# Patient Record
Sex: Male | Born: 1965 | Race: Black or African American | Hispanic: No | Marital: Single | Smoking: Current every day smoker
Health system: Southern US, Community
[De-identification: ages and names within clinical notes are randomized; demographics above are authoritative.]

## PROBLEM LIST (undated history)

## (undated) DIAGNOSIS — T1491XA Suicide attempt, initial encounter: Secondary | ICD-10-CM

## (undated) DIAGNOSIS — F1410 Cocaine abuse, uncomplicated: Secondary | ICD-10-CM

## (undated) HISTORY — PX: OTHER SURGICAL HISTORY: SHX169

---

## 2012-12-23 ENCOUNTER — Encounter (HOSPITAL_BASED_OUTPATIENT_CLINIC_OR_DEPARTMENT_OTHER): Payer: Self-pay | Admitting: Emergency Medicine

## 2012-12-23 ENCOUNTER — Emergency Department (HOSPITAL_BASED_OUTPATIENT_CLINIC_OR_DEPARTMENT_OTHER)
Admission: EM | Admit: 2012-12-23 | Discharge: 2012-12-24 | Disposition: A | Discharge status: Home / Self Care | Payer: Self-pay | Attending: Emergency Medicine | Admitting: Emergency Medicine

## 2012-12-23 ENCOUNTER — Emergency Department (HOSPITAL_BASED_OUTPATIENT_CLINIC_OR_DEPARTMENT_OTHER): Payer: Self-pay

## 2012-12-23 DIAGNOSIS — R45851 Suicidal ideations: Secondary | ICD-10-CM | POA: Insufficient documentation

## 2012-12-23 DIAGNOSIS — F1410 Cocaine abuse, uncomplicated: Secondary | ICD-10-CM | POA: Insufficient documentation

## 2012-12-23 DIAGNOSIS — F17200 Nicotine dependence, unspecified, uncomplicated: Secondary | ICD-10-CM | POA: Insufficient documentation

## 2012-12-23 DIAGNOSIS — F1910 Other psychoactive substance abuse, uncomplicated: Secondary | ICD-10-CM

## 2012-12-23 HISTORY — DX: Cocaine abuse, uncomplicated: F14.10

## 2012-12-23 HISTORY — DX: Suicide attempt, initial encounter: T14.91XA

## 2012-12-23 LAB — ETHANOL: Alcohol, Ethyl (B): <11 mg/dL (ref 0–11)

## 2012-12-23 LAB — CBC
HCT: 39.8 % (ref 39.0–52.0)
Hemoglobin: 13.7 g/dL (ref 13.0–17.0)
MCV: 80.2 fL (ref 78.0–100.0)
RBC: 4.96 MIL/uL (ref 4.22–5.81)
WBC: 4.5 10*3/uL (ref 4.0–10.5)

## 2012-12-23 LAB — COMPREHENSIVE METABOLIC PANEL
Albumin: 3.1 g/dL — ABNORMAL LOW (ref 3.5–5.2)
Alkaline Phosphatase: 57 U/L (ref 39–117)
BUN: 9 mg/dL (ref 6–23)
CO2: 23 mEq/L (ref 19–32)
Chloride: 107 mEq/L (ref 96–112)
Creatinine, Ser: 1.1 mg/dL (ref 0.50–1.35)
GFR calc non Af Amer: 78 mL/min — ABNORMAL LOW (ref >90)
Glucose, Bld: 89 mg/dL (ref 70–99)
Potassium: 3.6 mEq/L (ref 3.5–5.1)
Total Bilirubin: 0.5 mg/dL (ref 0.3–1.2)

## 2012-12-23 LAB — URINALYSIS, ROUTINE W REFLEX MICROSCOPIC
Glucose, UA: NEGATIVE mg/dL
Hgb urine dipstick: NEGATIVE
Leukocytes, UA: NEGATIVE
Protein, ur: NEGATIVE mg/dL
Specific Gravity, Urine: 1.026 (ref 1.005–1.030)
Urobilinogen, UA: 1 mg/dL (ref 0.0–1.0)

## 2012-12-23 LAB — RAPID URINE DRUG SCREEN, HOSP PERFORMED
Amphetamines: NOT DETECTED
Opiates: NOT DETECTED

## 2012-12-23 LAB — URIC ACID: Uric Acid, Serum: 7.3 mg/dL (ref 4.0–7.8)

## 2012-12-23 MED ORDER — ACETAMINOPHEN 325 MG PO TABS: 650.0000 mg | ORAL_TABLET | ORAL | Status: DC | PRN

## 2012-12-23 MED ORDER — ALUM & MAG HYDROXIDE-SIMETH 200-200-20 MG/5ML PO SUSP: 30.0000 mL | ORAL | Status: DC | PRN

## 2012-12-23 MED ORDER — ZOLPIDEM TARTRATE 5 MG PO TABS
5.0000 mg | ORAL_TABLET | Freq: Every evening | ORAL | Status: DC | PRN
  Filled 2012-12-23: qty 1

## 2012-12-23 MED ORDER — ONDANSETRON HCL 4 MG PO TABS: 4.0000 mg | ORAL_TABLET | Freq: Three times a day (TID) | ORAL | Status: DC | PRN

## 2012-12-23 NOTE — ED Notes (Signed)
The patient's belonging have been taken away from him. He had on boots, socks, pants, shorts and boxers. Then he had on a T-shirt, hat, dog tags, and 3 rubber bractlet's. He also brought in a book bag full of stuff. It was not check. They are all under the charge nurse desk.

## 2012-12-23 NOTE — ED Notes (Signed)
Mobile crisis is at bedside with the patient.

## 2012-12-23 NOTE — BH Assessment (Signed)
[facility] Assessment Progress Note  Pt referred via Marijean Niemann with [employer].  Pt reviewed with Berneice Heinrich, RN, Administrative Coordinator.  She does not feel that pt presents an immanent danger to self, and therefore declines pt for admission to [facility] based upon lack of criteria.  At 17:42 I called to Marijean Niemann to notify him.  Doylene Canning, MA Assessment Counselor 12/23/2012 @ 17:46

## 2012-12-23 NOTE — ED Notes (Signed)
[facility] called to transport to [place] 

## 2012-12-23 NOTE — ED Notes (Signed)
Patient admits to regularly using crack-cocaine. Last use was last night.

## 2012-12-23 NOTE — ED Notes (Signed)
Spoke with [facility] and they expressed they would be here in appx an hour and 40 minutes.

## 2012-12-23 NOTE — ED Provider Notes (Signed)
Pt with cocaine abuse, is reporting SI, but is vague about thoughts.  [facility] has seen, attempting placement.  There is no sitter available here at [facility] tonight, no BH ED rooms at [facility], will transfer to [facility].  Discussed with Dr. Rulon Abide.  Pt would likely benefit from a telepsych eval.  Rolan Bucco, MD 12/23/12 2333

## 2012-12-23 NOTE — ED Provider Notes (Signed)
  CSN: [id number]     Arrival date & time 12/23/12  1026 History     First MD Initiated Contact with Patient 12/23/12 1052     Chief Complaint  Patient presents with  . Leg Pain  . Medical Clearance   (Consider location/radiation/quality/duration/timing/severity/associated sxs/prior Treatment) Patient is a 47 y.o. male presenting with leg pain.  Leg Pain  Pt reports 3 days of moderate to severe non-traumatic R ankle pain, worse with movement. No history of same.   Pt also presents requesting detox from crack cocaine. Has a long history of cocaine abuse, has been through treatment before several years ago. Had been clean for 3 weeks until last night when he used again. He has vague suicidal thoughts, but no plan. He denies any CP, SOB. No HI, no hallucinations.  History reviewed. No pertinent past medical history. History reviewed. No pertinent past surgical history. History reviewed. No pertinent family history. History  Substance Use Topics  . Smoking status: Not on file  . Smokeless tobacco: Not on file  . Alcohol Use: Not on file    Review of Systems All other systems reviewed and are negative except as noted in HPI.   Allergies  Aspirin and Penicillins  Home Medications  No current outpatient prescriptions on file. BP 132/88  Pulse 62  Temp(Src) 98.3 F (36.8 C) (Oral)  Resp 20  Ht 5\' 9"  (1.753 m)  Wt 187 lb (84.823 kg)  BMI 27.6 kg/m2  SpO2 100% Physical Exam  Nursing note and vitals reviewed. Constitutional: He is oriented to person, place, and time. He appears well-developed and well-nourished.  HENT:  Head: Normocephalic and atraumatic.  Eyes: EOM are normal. Pupils are equal, round, and reactive to light.  Neck: Normal range of motion. Neck supple.  Cardiovascular: Normal rate, normal heart sounds and intact distal pulses.   Pulmonary/Chest: Effort normal and breath sounds normal.  Abdominal: Bowel sounds are normal. He exhibits no distension. There is no  tenderness.  Musculoskeletal: He exhibits tenderness (R ankle diffuse tender to palpation with mild warmth and erythema, no significant effusion, no deformity, NV intact). He exhibits no edema.  Neurological: He is alert and oriented to person, place, and time. He has normal strength. No cranial nerve deficit or sensory deficit.  Skin: Skin is warm and dry. No rash noted.  Psychiatric: He has a normal mood and affect.    ED Course   Procedures (including critical care time)  Labs Reviewed  URINE RAPID DRUG SCREEN (HOSP PERFORMED) - Abnormal; Notable for the following:    Cocaine POSITIVE (*)    All other components within normal limits  URINALYSIS, ROUTINE W REFLEX MICROSCOPIC - Abnormal; Notable for the following:    Ketones, ur 15 (*)    All other components within normal limits  CBC  COMPREHENSIVE METABOLIC PANEL  ETHANOL  URIC ACID   Dg Ankle Complete Right  12/23/2012   *RADIOLOGY REPORT*  Clinical Data: Ankle pain.  No injury.  RIGHT ANKLE - COMPLETE 3+ VIEW  Comparison: 0400  Findings: No acute fracture and no dislocation.  IMPRESSION: No acute bony pathology.   Original Report Authenticated By: Jolaine Click, M.D.   No diagnosis found.  MDM  Received call from Dr. Bonnielee Haff, pt had ankle xray at [facility] about 8 hours ago. Pt did not inform me he was there. Care signed out to Dr. Fredderick Phenix at shift change pending [facility] evaluation and placement.   Tecora Eustache B. Bernette Mayers, MD 12/24/12 1058

## 2012-12-23 NOTE — ED Notes (Signed)
Pt states he is having some right ankle/lower leg pain since Thursday.  Denies injury.  Some swelling per patient.  Pt also wants detox from crack cocaine.  Uses crack for over 20 years, last use yesterday.

## 2012-12-24 ENCOUNTER — Encounter (HOSPITAL_COMMUNITY): Payer: Self-pay | Admitting: Emergency Medicine

## 2012-12-24 NOTE — ED Provider Notes (Signed)
7:53 AM No SI. Pt presents with substance abuse. Outpatient referrals given  Lyanne Co, MD 12/24/12 [phone]

## 2012-12-24 NOTE — ED Notes (Signed)
According to EMS, patient responded to [facility] med center due to leg pain.  Once there, he let them know he wanted to harm himself.  He said he has done drugs in the past and was afraid to go home because he says he will do crack cocaine.

## 2012-12-27 ENCOUNTER — Encounter (HOSPITAL_COMMUNITY): Payer: Self-pay | Admitting: Emergency Medicine

## 2012-12-27 ENCOUNTER — Emergency Department (HOSPITAL_COMMUNITY)
Admission: EM | Admit: 2012-12-27 | Discharge: 2012-12-27 | Disposition: A | Discharge status: Home / Self Care | Payer: Self-pay | Attending: Emergency Medicine | Admitting: Emergency Medicine

## 2012-12-27 DIAGNOSIS — F17200 Nicotine dependence, unspecified, uncomplicated: Secondary | ICD-10-CM | POA: Insufficient documentation

## 2012-12-27 DIAGNOSIS — L03115 Cellulitis of right lower limb: Secondary | ICD-10-CM

## 2012-12-27 DIAGNOSIS — L03119 Cellulitis of unspecified part of limb: Secondary | ICD-10-CM | POA: Insufficient documentation

## 2012-12-27 DIAGNOSIS — F1410 Cocaine abuse, uncomplicated: Secondary | ICD-10-CM | POA: Insufficient documentation

## 2012-12-27 DIAGNOSIS — L02419 Cutaneous abscess of limb, unspecified: Secondary | ICD-10-CM | POA: Insufficient documentation

## 2012-12-27 LAB — COMPREHENSIVE METABOLIC PANEL
ALT: 19 U/L (ref 0–53)
Albumin: 3.2 g/dL — ABNORMAL LOW (ref 3.5–5.2)
Alkaline Phosphatase: 56 U/L (ref 39–117)
Chloride: 107 mEq/L (ref 96–112)
Potassium: 4 mEq/L (ref 3.5–5.1)
Sodium: 143 mEq/L (ref 135–145)
Total Bilirubin: 0.2 mg/dL — ABNORMAL LOW (ref 0.3–1.2)
Total Protein: 5.6 g/dL — ABNORMAL LOW (ref 6.0–8.3)

## 2012-12-27 LAB — CBC
Hemoglobin: 14.8 g/dL (ref 13.0–17.0)
MCHC: 33 g/dL (ref 30.0–36.0)
RDW: 13.5 % (ref 11.5–15.5)
WBC: 5.3 10*3/uL (ref 4.0–10.5)

## 2012-12-27 MED ORDER — CEPHALEXIN 500 MG PO CAPS: 500.0000 mg | ORAL_CAPSULE | Freq: Four times a day (QID) | ORAL | Status: DC

## 2012-12-27 MED ORDER — SODIUM CHLORIDE 0.9 % IV SOLN
INTRAVENOUS | Status: DC
  Administered 2012-12-27: 08:00:00 via INTRAVENOUS

## 2012-12-27 MED ORDER — CEFAZOLIN SODIUM-DEXTROSE 2-3 GM-% IV SOLR
2.0000 g | Freq: Once | INTRAVENOUS | Status: AC
  Administered 2012-12-27: 2 g via INTRAVENOUS
  Filled 2012-12-27: qty 50

## 2012-12-27 NOTE — ED Notes (Signed)
PT ambulated with baseline gait; VSS; A&Ox3; no signs of distress; respirations even and unlabored; skin warm and dry; no questions upon discharge. PT informed that px is free at [facility] if he applies for a VIC card.

## 2012-12-27 NOTE — ED Notes (Addendum)
PT reports he was recently at [facility] for R leg pain; reports he was also there for substance abuse and SI but denies that right now. Pt asked to inform RN if that changes. R leg is swollen and painful. Denies SOB or chest pain.

## 2012-12-27 NOTE — ED Notes (Signed)
Case manager called to assist with med refill.

## 2012-12-27 NOTE — ED Provider Notes (Signed)
CSN: [id number]     Arrival date & time 12/27/12  0709 History     First MD Initiated Contact with Patient 12/27/12 [phone]     Chief Complaint  Patient presents with  . Leg Pain   (Consider location/radiation/quality/duration/timing/severity/associated sxs/prior Treatment) Patient is a 47 y.o. male presenting with leg pain. The history is provided by the patient.  Leg Pain Associated symptoms: no fever   pt c/o right lower leg pain for the past few days. Associated redness and swelling. Pt denies injury, trauma or fall. Pain constant, dull, non radiating. Worse w palpation. No fever or chills. Denies hx same symptoms. No hx dvt or pe. No hx gout. Denies cp or sob. Pt went to [facility], reports u/s and xrays neg. Was given rx for abx but hasnt filled.      Past Medical History  Diagnosis Date  . Cocaine abuse   . Suicide attempt     advises has attempted to hang self in past   History reviewed. No pertinent past surgical history. History reviewed. No pertinent family history. History  Substance Use Topics  . Smoking status: Current Every Day Smoker -- 0.50 packs/day  . Smokeless tobacco: Not on file  . Alcohol Use: No    Review of Systems  Constitutional: Negative for fever.  Respiratory: Negative for shortness of breath.   Cardiovascular: Negative for chest pain.  Musculoskeletal: Negative for joint swelling.  Skin: Negative for wound.  Neurological: Negative for numbness.    Allergies  Aspirin and Penicillins  Home Medications  No current outpatient prescriptions on file. BP 127/77  Pulse 65  Temp(Src) 98.3 F (36.8 C) (Oral)  Resp 18  SpO2 100% Physical Exam  Nursing note and vitals reviewed. Constitutional: He is oriented to person, place, and time. He appears well-developed and well-nourished. No distress.  HENT:  Head: Atraumatic.  Eyes: Conjunctivae are normal.  Neck: Neck supple. No tracheal deviation present.  Cardiovascular: Normal rate.    Pulmonary/Chest: Effort normal. No accessory muscle usage. No respiratory distress.  Abdominal: He exhibits no distension.  Musculoskeletal: Normal range of motion.  Mild swelling, tenderness and erythema to right lower leg above ankle, esp anteriorly. No calf pain or swelling. Skin intact, no abrasions/lacs. Distal pulses palp.  Exam c/w cellulitis. No crepitus.   Neurological: He is alert and oriented to person, place, and time.  Skin: Skin is warm and dry.  Psychiatric: He has a normal mood and affect.    ED Course   Procedures (including critical care time)  Results for orders placed during the hospital encounter of 12/27/12  CBC      Result Value Range   WBC 5.3  4.0 - 10.5 K/uL   RBC 5.49  4.22 - 5.81 MIL/uL   Hemoglobin 14.8  13.0 - 17.0 g/dL   HCT 42.5  95.6 - 38.7 %   MCV 81.6  78.0 - 100.0 fL   MCH 27.0  26.0 - 34.0 pg   MCHC 33.0  30.0 - 36.0 g/dL   RDW 56.4  33.2 - 95.1 %   Platelets 271  150 - 400 K/uL  COMPREHENSIVE METABOLIC PANEL      Result Value Range   Sodium 143  135 - 145 mEq/L   Potassium 4.0  3.5 - 5.1 mEq/L   Chloride 107  96 - 112 mEq/L   CO2 28  19 - 32 mEq/L   Glucose, Bld 90  70 - 99 mg/dL   BUN 12  6 - 23 mg/dL   Creatinine, Ser 1.61  0.50 - 1.35 mg/dL   Calcium 8.9  8.4 - 09.6 mg/dL   Total Protein 5.6 (*) 6.0 - 8.3 g/dL   Albumin 3.2 (*) 3.5 - 5.2 g/dL   AST 23  0 - 37 U/L   ALT 19  0 - 53 U/L   Alkaline Phosphatase 56  39 - 117 U/L   Total Bilirubin 0.2 (*) 0.3 - 1.2 mg/dL   GFR calc non Af Amer 71 (*) >90 mL/min   GFR calc Af Amer 83 (*) >90 mL/min   Dg Ankle Complete Right  12/23/2012   *RADIOLOGY REPORT*  Clinical Data: Ankle pain.  No injury.  RIGHT ANKLE - COMPLETE 3+ VIEW  Comparison: 0400  Findings: No acute fracture and no dislocation.  IMPRESSION: No acute bony pathology.   Original Report Authenticated By: Jolaine Click, M.D.      MDM  Iv ancef 2 gm iv.   Labs.  Reviewed nursing notes and prior charts for additional  history.   Records from [facility] obtained, 7/26, pt had vascular u/s right leg neg for dvt, had xrays neg, and labs normal.  Pt appears stable for d/c.  Will refer to [facility] for pcp/f/u, sw/pharmacy to assist w abx.     Suzi Roots, MD 12/27/12 1014

## 2014-01-27 IMAGING — CR DG ANKLE COMPLETE 3+V*R*
3 series · 3 of 3 positions shown · non-contrast
Comparison: 9199

CLINICAL DATA: Ankle pain.  No injury.

RIGHT ANKLE - COMPLETE 3+ VIEW

[t ankle joint ap right]
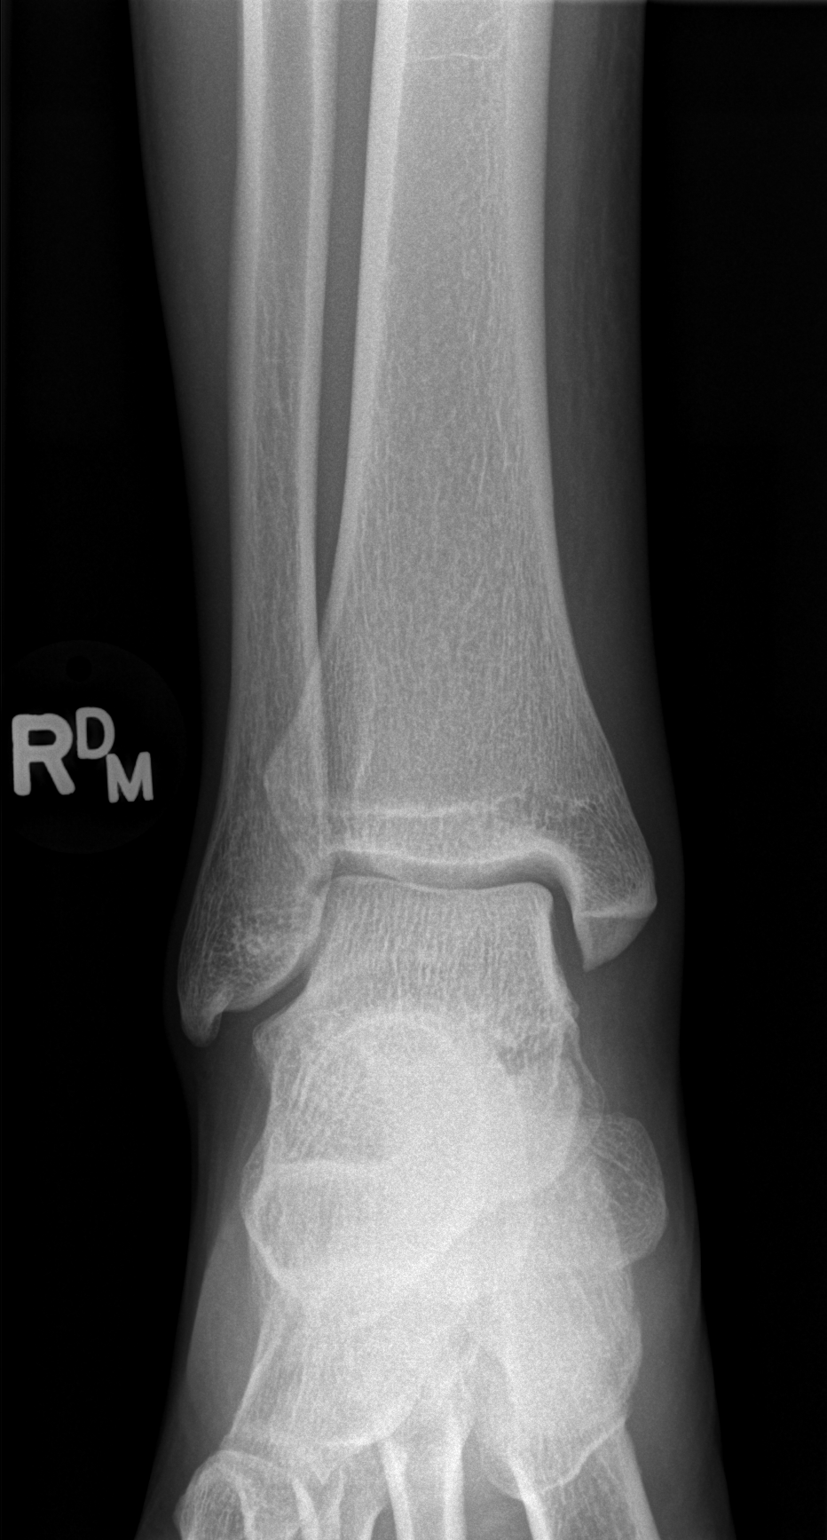

[t ankle joint oblique right]
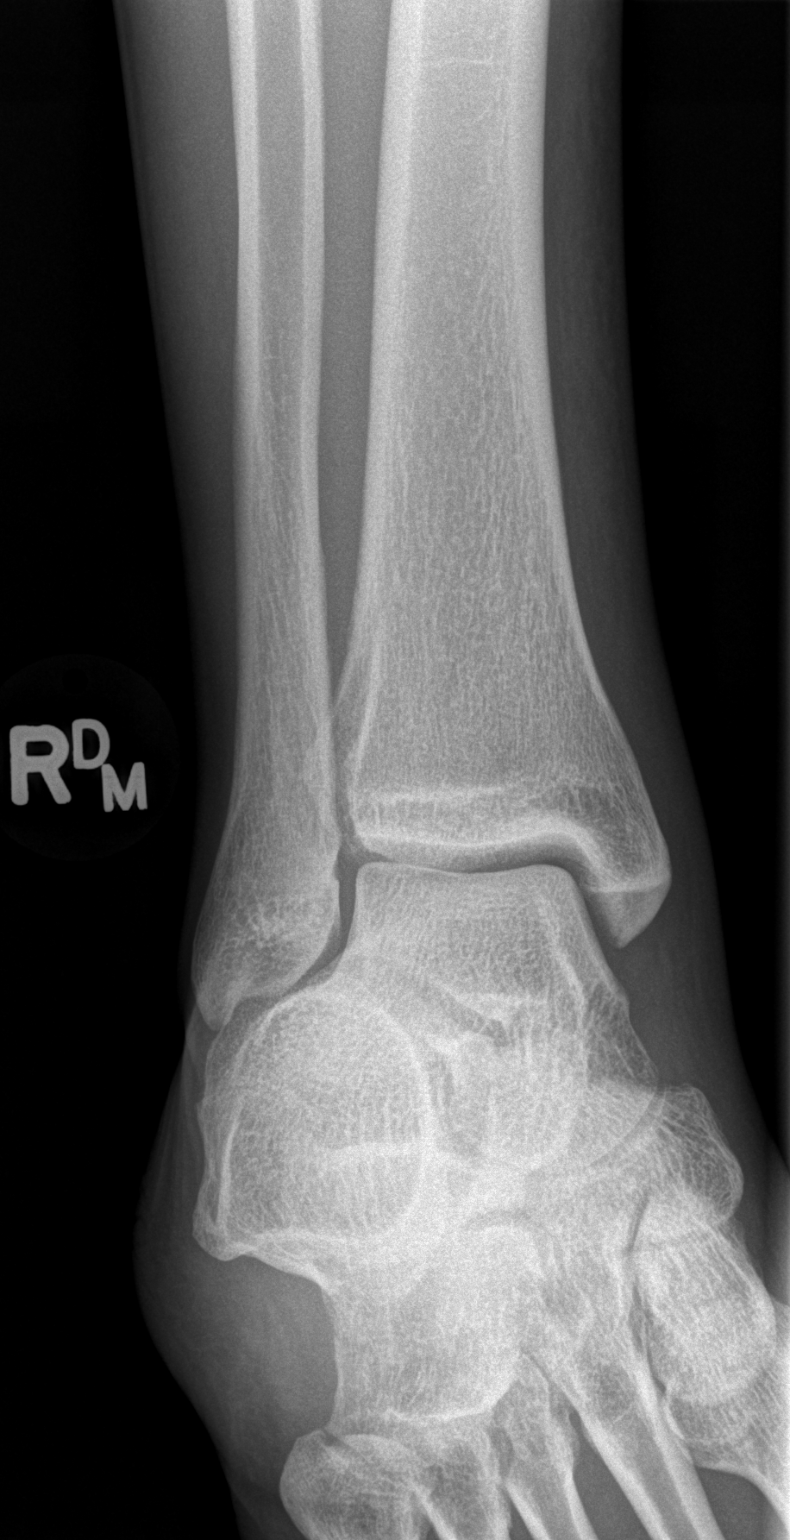

[t ankle joint lat right]
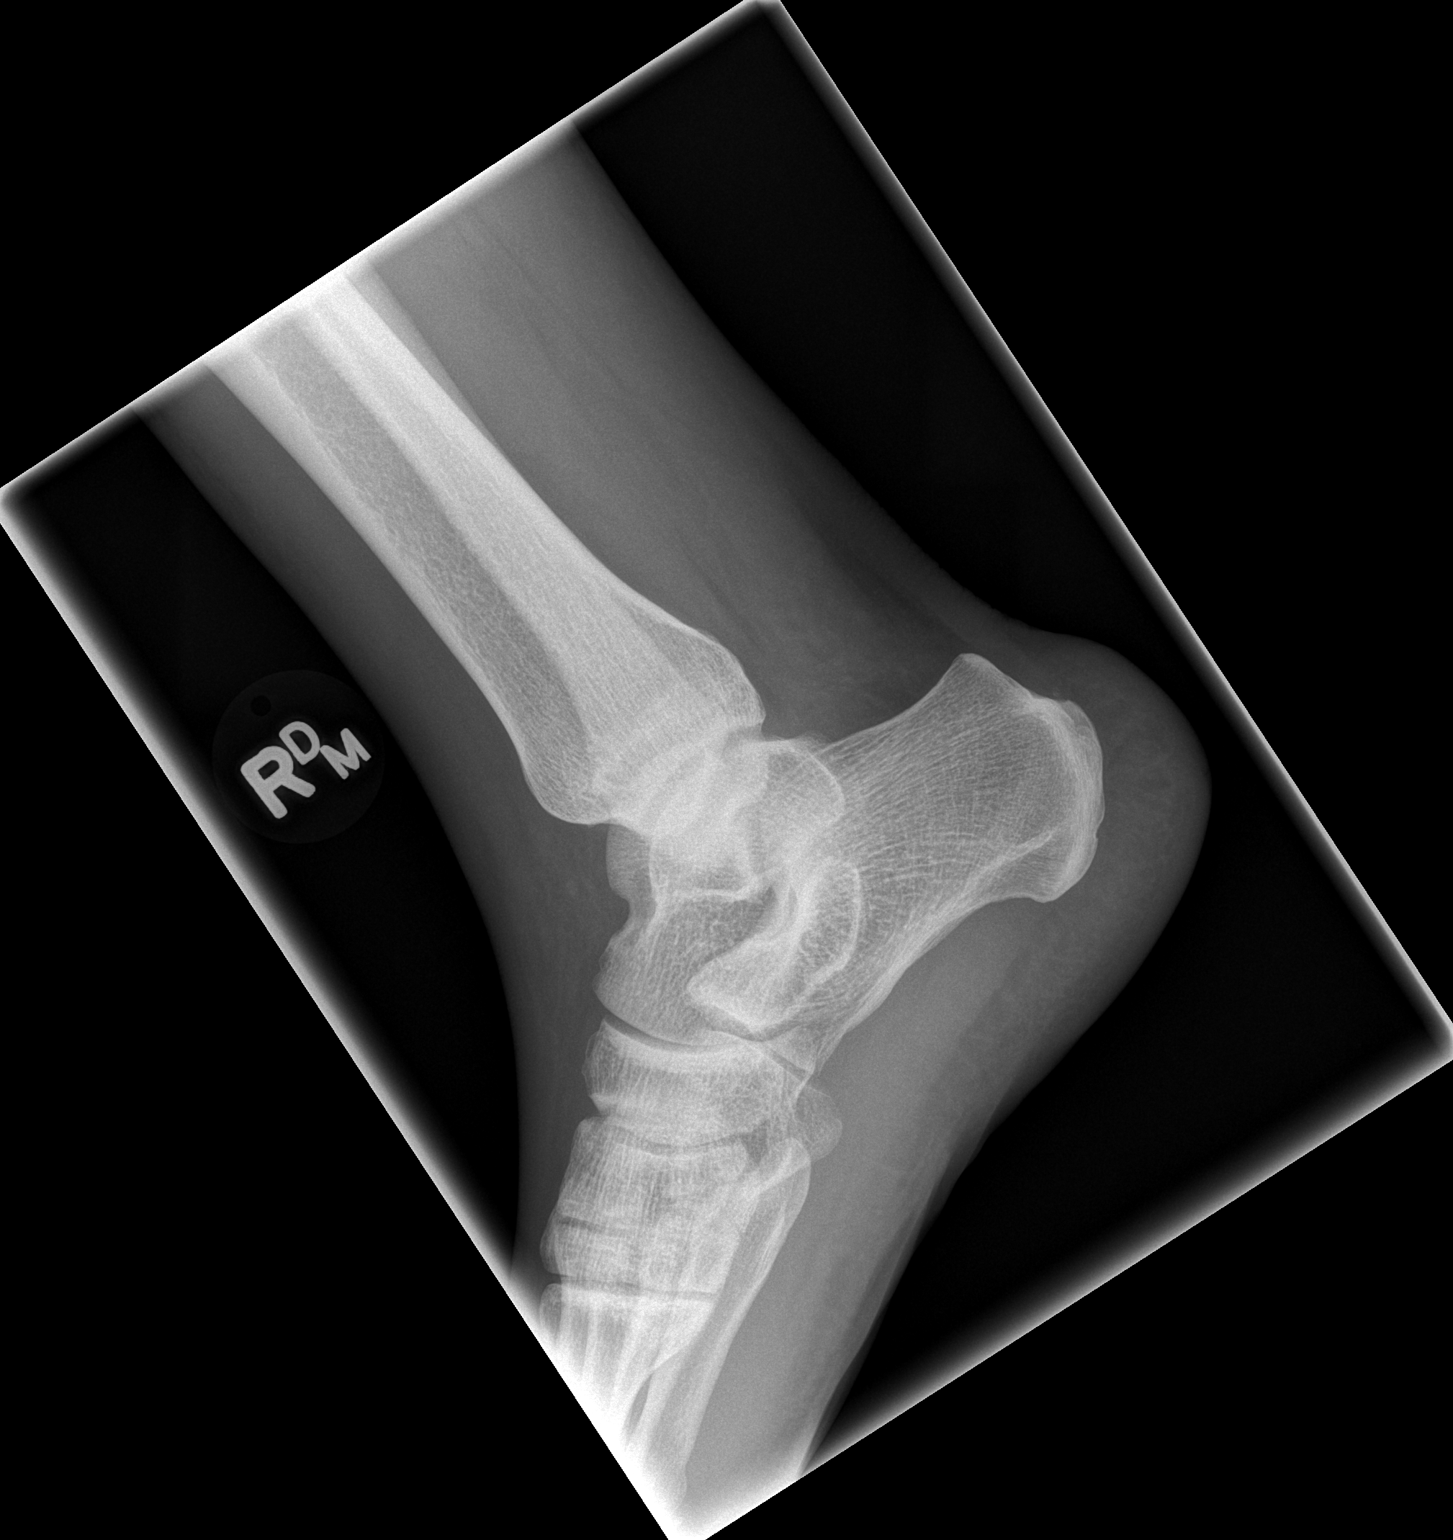

[3 of 3 positions shown; findings below may reference images not displayed]

FINDINGS: No acute fracture and no dislocation.
IMPRESSION: No acute bony pathology.

## 2020-08-30 DIAGNOSIS — F17210 Nicotine dependence, cigarettes, uncomplicated: Secondary | ICD-10-CM | POA: Insufficient documentation

## 2020-08-30 DIAGNOSIS — F1491 Cocaine use, unspecified, in remission: Secondary | ICD-10-CM | POA: Insufficient documentation

## 2020-08-30 DIAGNOSIS — F1410 Cocaine abuse, uncomplicated: Secondary | ICD-10-CM | POA: Insufficient documentation

## 2022-06-08 ENCOUNTER — Ambulatory Visit (HOSPITAL_COMMUNITY)
Admission: EM | Admit: 2022-06-08 | Discharge: 2022-06-08 | Disposition: A | Discharge status: Home / Self Care | Payer: 59 | Attending: Family Medicine | Admitting: Family Medicine

## 2022-06-08 ENCOUNTER — Encounter (HOSPITAL_COMMUNITY): Payer: Self-pay

## 2022-06-08 DIAGNOSIS — J069 Acute upper respiratory infection, unspecified: Secondary | ICD-10-CM | POA: Diagnosis not present

## 2022-06-08 LAB — POC INFLUENZA A AND B ANTIGEN (URGENT CARE ONLY)
INFLUENZA A ANTIGEN, POC: NEGATIVE
INFLUENZA B ANTIGEN, POC: NEGATIVE

## 2022-06-08 MED ORDER — PROMETHAZINE-DM 6.25-15 MG/5ML PO SYRP: 5.0000 mL | ORAL_SOLUTION | Freq: Four times a day (QID) | ORAL | 0 refills | Status: DC | PRN

## 2022-06-08 MED ORDER — ACETAMINOPHEN 500 MG PO TABS: 500.0000 mg | ORAL_TABLET | Freq: Four times a day (QID) | ORAL | 0 refills | Status: DC | PRN

## 2022-06-08 NOTE — Discharge Instructions (Signed)
Follow up with your primary care doctor or here if you are not seeing improvement of your symptoms over the next several days, sooner if you feel you are worsening.  Caring for yourself: Get plenty of rest. Drink plenty of fluids, enough so that your urine is light yellow or clear like water. If you have kidney, heart, or liver disease and have to limit fluids, talk with your doctor before you increase the amount of fluids you drink. Take an over-the-counter pain medicine if needed, such as acetaminophen (Tylenol), ibuprofen (Advil, Motrin), or naproxen (Aleve), to relieve fever, headache, and muscle aches. Read and follow all instructions on the label. No one younger than 20 should take aspirin. It has been linked to Reye syndrome, a serious illness. Before you use over the counter cough and cold medicines, check the label. These medicines may not be safe for children younger than age 41 or for people with certain health problems. If the skin around your nose and lips becomes sore, put some petroleum jelly on the area.  Avoid spreading a viral illness: Wash your hands regularly, and keep your hands away from your face.  Stay home from school, work, and other public places until you are feeling better and your fever has been gone for at least 24 hours. The fever needs to have gone away on its own without the help of medicine.

## 2022-06-08 NOTE — ED Provider Notes (Signed)
  [facility]   [id number] 06/08/22 Arrival Time: 8127  ASSESSMENT & PLAN:  1. Viral URI with cough    Discussed typical duration of likely viral illness.  Results for orders placed or performed during the hospital encounter of 06/08/22  POC Influenza A & B Ag (Urgent Care)  Result Value Ref Range   INFLUENZA A ANTIGEN, POC NEGATIVE NEGATIVE   INFLUENZA B ANTIGEN, POC NEGATIVE NEGATIVE    OTC symptom care as needed.  Meds ordered this encounter  Medications   promethazine-dextromethorphan (PROMETHAZINE-DM) 6.25-15 MG/5ML syrup    Sig: Take 5 mLs by mouth 4 (four) times daily as needed for cough.    Dispense:  118 mL    Refill:  0   acetaminophen (TYLENOL) 500 MG tablet    Sig: Take 1 tablet (500 mg total) by mouth every 6 (six) hours as needed for moderate pain or fever.    Dispense:  30 tablet    Refill:  0   May f/u as needed.  Reviewed expectations re: course of current medical issues. Questions answered. Outlined signs and symptoms indicating need for more acute intervention. Understanding verbalized. After Visit Summary given.   SUBJECTIVE: History from: Patient. Ruben Mitchell is a 57 y.o. male. Reports: runny nose, nasal cong, HA, cough, body aches, chills; abrupt onset; x 2 days. Denies: difficulty breathing. Normal PO intake without n/v/d. Currently staying at shelter past 7 days,.  OBJECTIVE:  Vitals:   06/08/22 1220  Pulse: 89  Resp: 16  Temp: 100 F (37.8 C)  TempSrc: Oral  SpO2: 92%    General appearance: alert; no distress Eyes: PERRLA; EOMI; conjunctiva normal HENT: [place]; AT; with nasal congestion Neck: supple  Lungs: speaks full sentences without difficulty; unlabored; dry cough; ctab Extremities: no edema Skin: warm and dry Neurologic: normal gait Psychological: alert and cooperative; normal mood and affect   Allergies  Allergen Reactions   Aspirin Other (See Comments)    unknown   Penicillins Other (See Comments)    unknown     Past Medical History:  Diagnosis Date   Cocaine abuse ([place])    Suicide attempt ([place])    advises has attempted to hang self in past   Social History   Socioeconomic History   Marital status: Single    Spouse name: Not on file   Number of children: Not on file   Years of education: Not on file   Highest education level: Not on file  Occupational History   Not on file  Tobacco Use   Smoking status: Every Day    Packs/day: 0.50    Types: Cigarettes   Smokeless tobacco: Not on file  Substance and Sexual Activity   Alcohol use: No   Drug use: Yes    Frequency: 2.0 times per week    Types: Cocaine, Marijuana   Sexual activity: Not on file  Other Topics Concern   Not on file  Social History Narrative   Not on file   Social Determinants of Health   Financial Resource Strain: Not on file  Food Insecurity: Not on file  Transportation Needs: Not on file  Physical Activity: Not on file  Stress: Not on file  Social Connections: Not on file  Intimate Partner Violence: Not on file   History reviewed. No pertinent family history. History reviewed. No pertinent surgical history.   Vanessa Kick, MD 06/08/22 [phone]

## 2022-06-08 NOTE — ED Triage Notes (Signed)
Pt is here for runny nose, nasal congestion, headache, cough, back pain, chills, body aches, loss of appetite, x 2days

## 2022-06-08 NOTE — Congregational Nurse Program (Signed)
  Dept: [phone]   Congregational Nurse Program Note  Date of Encounter: 06/08/2022  Clinic visit for respirator symptoms of cough, congestion and yesterday body aches.  Temp 99, BP 127/88, O2 Sat 94%, pulse 88 and regular.  Large amount nasal drainage.  Has [employer] but no PCP arranged transportation to go to [facility] urgent Care to evaluate for flu. Past Medical History: Past Medical History:  Diagnosis Date   Cocaine abuse ([place])    Suicide attempt [facility])    advises has attempted to hang self in past    Encounter Details:  CNP Questionnaire - 06/08/22 1010       Questionnaire   Ask client: Do you give verbal consent for me to treat you today? Yes    Student Assistance N/A    Location Patient [facility]    Visit Setting with Client Organization    Patient Status Research officer, political party or [place]    Insurance/Financial Assistance Referral N/A    Medication Have Medication Insecurities    Medical Provider No    Screening Referrals Made N/A    Medical Referrals Made Urgent Care    Medical Appointment Made N/A    Recently w/o PCP, now 1st time PCP visit completed due to CNs referral or appointment made N/A    Food Have Food Insecurities    Transportation Need transportation assistance;Provided transportation assistance    Housing/Utilities No permanent housing    Interpersonal Safety Do not feel safe at current residence    Interventions Advocate/Support;Navigate Healthcare System;Counsel;Case Management;Educate    Abnormal to Normal Screening Since Last CN Visit N/A    Screenings CN Performed Blood Pressure;Temperature;Pulse Ox    Sent Client to Lab for: N/A    Did client attend any of the following based off CNs referral or appointments made? N/A    ED Visit Averted Yes    Life-Saving Intervention Made N/A

## 2022-06-09 ENCOUNTER — Encounter: Payer: Self-pay | Admitting: Physician Assistant

## 2022-06-09 ENCOUNTER — Other Ambulatory Visit: Payer: Self-pay | Admitting: Critical Care Medicine

## 2022-06-09 ENCOUNTER — Other Ambulatory Visit: Payer: Self-pay

## 2022-06-09 MED ORDER — PROMETHAZINE-DM 6.25-15 MG/5ML PO SYRP
5.0000 mL | ORAL_SOLUTION | Freq: Four times a day (QID) | ORAL | 0 refills | Status: DC | PRN
  Filled 2022-06-09: qty 118, 6d supply, fill #0

## 2022-06-09 NOTE — Progress Notes (Cosign Needed Addendum)
Pt seen by Dr Joya Gaskins.  He went to the ER yesterday for URI, flu negative, not tested for COVID.   He was COVID negative on entry to the shelter 01/04. Sx started not too long after that.   Cough prod yellow sputum. He had some chills, but fever is unknown.   Sx began about a week ago.   Not working, has [employer]. Needs [facility]. However, does not have money to pay for rx >> rx will be sent to [facility].   Cocaine was a long time ago, not suicidal now.   He still smokes, but plans to quit by the end of the month.   COVID test negative  Lungs clear, clear nasal secretions. Poor dentition.   Wears size 12 shoes, toenails are very long, but no infection. He was given new shoes and socks, will be scheduled with podiatry when he comes to the office.   Today's Vitals   06/09/22 1532  BP: (!) 148/86  Pulse: 72  SpO2: 96%   There is no height or weight on file to calculate BMI.   He will pick up meds tomorrow, riding the bus. Was given Tylenol at his request.   He will be called w/ an appt.   Rosaria Ferries, PA-C 06/09/2022 3:24 PM

## 2022-06-10 ENCOUNTER — Other Ambulatory Visit: Payer: Self-pay

## 2022-06-10 ENCOUNTER — Other Ambulatory Visit (HOSPITAL_COMMUNITY): Payer: Self-pay

## 2022-06-14 ENCOUNTER — Encounter: Payer: Self-pay | Admitting: Physician Assistant

## 2022-06-14 ENCOUNTER — Ambulatory Visit: Payer: 59 | Admitting: Physician Assistant

## 2022-06-14 VITALS — BP 142/92 | HR 72 | Ht 69.0 in | Wt 199.0 lb

## 2022-06-14 DIAGNOSIS — R030 Elevated blood-pressure reading, without diagnosis of hypertension: Secondary | ICD-10-CM | POA: Diagnosis not present

## 2022-06-14 DIAGNOSIS — Z114 Encounter for screening for human immunodeficiency virus [HIV]: Secondary | ICD-10-CM

## 2022-06-14 DIAGNOSIS — Z0001 Encounter for general adult medical examination with abnormal findings: Secondary | ICD-10-CM

## 2022-06-14 DIAGNOSIS — D72819 Decreased white blood cell count, unspecified: Secondary | ICD-10-CM | POA: Diagnosis not present

## 2022-06-14 DIAGNOSIS — Z13220 Encounter for screening for lipoid disorders: Secondary | ICD-10-CM

## 2022-06-14 DIAGNOSIS — Z5901 Sheltered homelessness: Secondary | ICD-10-CM

## 2022-06-14 DIAGNOSIS — Z125 Encounter for screening for malignant neoplasm of prostate: Secondary | ICD-10-CM

## 2022-06-14 DIAGNOSIS — F17210 Nicotine dependence, cigarettes, uncomplicated: Secondary | ICD-10-CM

## 2022-06-14 DIAGNOSIS — Z1159 Encounter for screening for other viral diseases: Secondary | ICD-10-CM

## 2022-06-14 DIAGNOSIS — Z13228 Encounter for screening for other metabolic disorders: Secondary | ICD-10-CM

## 2022-06-14 NOTE — Progress Notes (Unsigned)
New Patient Office Visit  Subjective    Patient ID: Ruben Mitchell, male    DOB: Sep 11, 1965  Age: 57 y.o. MRN: [id number]  CC:  Chief Complaint  Patient presents with   Annual Exam    To donate plasma     HPI Ruben Mitchell requests physical without concerns  States that he donates plasma and they requested he have one.  Denies any history of hypertension, states that he does not check his blood pressure at home. Return for fasting labs two weeks   Sleep is good, appetite is good, no other concerns at this time.  Outpatient Encounter Medications as of 06/14/2022  Medication Sig   acetaminophen (TYLENOL) 500 MG tablet Take 1 tablet (500 mg total) by mouth every 6 (six) hours as needed for moderate pain or fever. (Patient not taking: Reported on 06/14/2022)   promethazine-dextromethorphan (PROMETHAZINE-DM) 6.25-15 MG/5ML syrup Take 5 mLs by mouth 4 (four) times daily as needed for cough. (Patient not taking: Reported on 06/14/2022)   No facility-administered encounter medications on file as of 06/14/2022.    Past Medical History:  Diagnosis Date   Cocaine abuse ([place])    Suicide attempt [facility])    advises has attempted to hang self in past    History reviewed. No pertinent surgical history.  History reviewed. No pertinent family history.  Social History   Socioeconomic History   Marital status: Single    Spouse name: Not on file   Number of children: Not on file   Years of education: Not on file   Highest education level: Not on file  Occupational History   Not on file  Tobacco Use   Smoking status: Every Day    Packs/day: 0.50    Types: Cigarettes   Smokeless tobacco: Not on file  Substance and Sexual Activity   Alcohol use: No   Drug use: Yes    Frequency: 2.0 times per week    Types: Cocaine, Marijuana   Sexual activity: Not on file  Other Topics Concern   Not on file  Social History Narrative   Not on file   Social Determinants of Health   Financial  Resource Strain: Not on file  Food Insecurity: Not on file  Transportation Needs: Not on file  Physical Activity: Not on file  Stress: Not on file  Social Connections: Not on file  Intimate Partner Violence: Not on file    Review of Systems  Constitutional: Negative.   HENT: Negative.    Eyes: Negative.   Respiratory:  Negative for shortness of breath.   Cardiovascular:  Negative for chest pain.  Gastrointestinal: Negative.   Genitourinary: Negative.   Musculoskeletal: Negative.   Skin: Negative.   Neurological: Negative.   Endo/Heme/Allergies: Negative.   Psychiatric/Behavioral: Negative.          Objective    BP (!) 142/92   Pulse 72   Ht 5\' 9"  (1.753 m)   Wt 199 lb (90.3 kg)   SpO2 100%   BMI 29.39 kg/m   Physical Exam Vitals and nursing note reviewed.  Constitutional:      Appearance: Normal appearance.  HENT:     Head: Normocephalic and atraumatic.     Right Ear: External ear normal.     Left Ear: External ear normal.     Nose: Nose normal.     Mouth/Throat:     Mouth: Mucous membranes are moist.     Pharynx: Oropharynx is clear.  Eyes:  Extraocular Movements: Extraocular movements intact.     Conjunctiva/sclera: Conjunctivae normal.     Pupils: Pupils are equal, round, and reactive to light.  Cardiovascular:     Rate and Rhythm: Normal rate and regular rhythm.     Pulses: Normal pulses.     Heart sounds: Normal heart sounds.  Pulmonary:     Effort: Pulmonary effort is normal.     Breath sounds: Normal breath sounds.  Musculoskeletal:        General: Normal range of motion.     Cervical back: Normal range of motion and neck supple.  Skin:    General: Skin is warm and dry.  Neurological:     General: No focal deficit present.     Mental Status: He is alert and oriented to person, place, and time.  Psychiatric:        Mood and Affect: Mood normal.        Behavior: Behavior normal.        Thought Content: Thought content normal.         Judgment: Judgment normal.         Assessment & Plan:   Problem List Items Addressed This Visit       Other   Elevated blood pressure reading in office without diagnosis of hypertension   Relevant Orders   TSH   Sheltered homelessness   Leukopenia   Relevant Orders   CBC with Differential/Platelet   Other Visit Diagnoses     Abnormal wellness exam    -  Primary   Screening, lipid       Relevant Orders   Lipid panel   Screening for metabolic disorder       Relevant Orders   Comp. Metabolic Panel (12)   Encounter for screening for HIV       Relevant Orders   HIV antibody (with reflex)   Encounter for HCV screening test for low risk patient       Relevant Orders   HCV Ab w Reflex to Quant PCR   Screening PSA (prostate specific antigen)       Relevant Orders   PSA     1. Abnormal wellness exam   2. Elevated blood pressure reading in office without diagnosis of hypertension Patient encouraged to check blood pressure at home, keep a written log and have available for all office visits.  Patient to return to mobile unit for fasting labs to be completed.  Patient has upcoming up to establish care with Dr. Joya Gaskins at [facility] on July 14, 2022.  Red flags given for prompt reevaluation - TSH; Future  3. Screening, lipid  - Lipid panel; Future  4. Leukopenia, unspecified type  - CBC with Differential/Platelet; Future  5. Screening for metabolic disorder  - Comp. Metabolic Panel (12); Future  6. Encounter for screening for HIV  - HIV antibody (with reflex); Future  7. Encounter for HCV screening test for low risk patient  - HCV Ab w Reflex to Quant PCR; Future  8. Sheltered homelessness   9. Screening PSA (prostate specific antigen)  - PSA; Future   I have reviewed the patient's medical history (PMH, PSH, Social History, Family History, Medications, and allergies) , and have been updated if relevant. I spent 25  minutes reviewing chart and  face to face time with patient.     Return in about 2 weeks (around 06/28/2022) for Fasting  labs with [facility].   Loraine Grip Mayers, PA-C

## 2022-06-14 NOTE — Patient Instructions (Signed)
Your blood pressure is slightly elevated today, I encourage you to check your blood pressure at home, keep a written log and have available for all office visits.  Please return to the mobile unit in 2 weeks to have your fasting labs completed.  Kennieth Rad, PA-C Physician Assistant [place] [email or web address]   How to Take Your Blood Pressure Blood pressure is a measurement of how strongly your blood is pressing against the walls of your arteries. Arteries are blood vessels that carry blood from your heart throughout your body. Your health care provider takes your blood pressure at each office visit. You can also take your own blood pressure at home with a blood pressure monitor. You may need to take your own blood pressure to: Confirm a diagnosis of high blood pressure (hypertension). Monitor your blood pressure over time. Make sure your blood pressure medicine is working. Supplies needed: Blood pressure monitor. A chair to sit in. This should be a chair where you can sit upright with your back supported. Do not sit on a soft couch or an armchair. Table or desk. Small notebook and pencil or pen. How to prepare To get the most accurate reading, avoid the following for 30 minutes before you check your blood pressure: Drinking caffeine. Drinking alcohol. Eating. Smoking. Exercising. Five minutes before you check your blood pressure: Use the bathroom and urinate so that you have an empty bladder. Sit quietly in a chair. Do not talk. How to take your blood pressure To check your blood pressure, follow the instructions in the manual that came with your blood pressure monitor. If you have a digital blood pressure monitor, the instructions may be as follows: Sit up straight in a chair. Place your feet on the floor. Do not cross your ankles or legs. Rest your left arm at the level of your heart on a table or desk or on the arm  of a chair. Pull up your shirt sleeve. Wrap the blood pressure cuff around the upper part of your left arm, 1 inch (2.5 cm) above your elbow. It is best to wrap the cuff around bare skin. Fit the cuff snugly, but not too tightly, around your arm. You should be able to place only one finger between the cuff and your arm. Position the cord so that it rests in the bend of your elbow. Press the power button. Sit quietly while the cuff inflates and deflates. Read the digital reading on the monitor screen and write the numbers down (record them) in a notebook. Wait 2-3 minutes, then repeat the steps, starting at step 1. What does my blood pressure reading mean? A blood pressure reading consists of a higher number over a lower number. Ideally, your blood pressure should be below 120/80. The first ("top") number is called the systolic pressure. It is a measure of the pressure in your arteries as your heart beats. The second ("bottom") number is called the diastolic pressure. It is a measure of the pressure in your arteries as the heart relaxes. Blood pressure is classified into four stages. The following are the stages for adults who do not have a short-term serious illness or a chronic condition. Systolic pressure and diastolic pressure are measured in a unit called mm Hg (millimeters of mercury).  Normal Systolic pressure: below 657. Diastolic pressure: below 80. Elevated Systolic pressure: 846-962. Diastolic pressure: below 80. Hypertension stage 1 Systolic pressure: 952-841. Diastolic pressure: 32-44. Hypertension stage 2 Systolic pressure: 010 or above. Diastolic  pressure: 90 or above. You can have elevated blood pressure or hypertension even if only the systolic or only the diastolic number in your reading is higher than normal. Follow these instructions at home: Medicines Take over-the-counter and prescription medicines only as told by your health care provider. Tell your health care  provider if you are having any side effects from blood pressure medicine. General instructions Check your blood pressure as often as recommended by your health care provider. Check your blood pressure at the same time every day. Take your monitor to the next appointment with your health care provider to make sure that: You are using it correctly. It provides accurate readings. Understand what your goal blood pressure numbers are. Keep all follow-up visits. This is important. General tips Your health care provider can suggest a reliable monitor that will meet your needs. There are several types of home blood pressure monitors. Choose a monitor that has an arm cuff. Do not choose a monitor that measures your blood pressure from your wrist or finger. Choose a cuff that wraps snugly, not too tight or too loose, around your upper arm. You should be able to fit only one finger between your arm and the cuff. You can buy a blood pressure monitor at most drugstores or online. Where to find more information American Heart Association: www.heart.org Contact a health care provider if: Your blood pressure is consistently high. Your blood pressure is suddenly low. Get help right away if: Your systolic blood pressure is higher than 180. Your diastolic blood pressure is higher than 120. These symptoms may be an emergency. Get help right away. Call 911. Do not wait to see if the symptoms will go away. Do not drive yourself to the hospital. Summary Blood pressure is a measurement of how strongly your blood is pressing against the walls of your arteries. A blood pressure reading consists of a higher number over a lower number. Ideally, your blood pressure should be below 120/80. Check your blood pressure at the same time every day. Avoid caffeine, alcohol, smoking, and exercise for 30 minutes prior to checking your blood pressure. These agents can affect the accuracy of the blood pressure reading. This  information is not intended to replace advice given to you by your health care provider. Make sure you discuss any questions you have with your health care provider. Document Revised: 01/29/2021 Document Reviewed: 01/29/2021 Elsevier Patient Education  [place].

## 2022-06-15 DIAGNOSIS — D72819 Decreased white blood cell count, unspecified: Secondary | ICD-10-CM | POA: Insufficient documentation

## 2022-06-15 DIAGNOSIS — R030 Elevated blood-pressure reading, without diagnosis of hypertension: Secondary | ICD-10-CM | POA: Insufficient documentation

## 2022-06-15 DIAGNOSIS — Z5901 Sheltered homelessness: Secondary | ICD-10-CM | POA: Insufficient documentation

## 2022-06-22 NOTE — Congregational Nurse Program (Signed)
  Dept: [phone]   Congregational Nurse Program Note  Date of Encounter: 06/22/2022  Clinic visit for blood pressure check, BP 130/81, pulse 64 and regular, O2 Sat 96%.  Starting a new job today, discussed continuing to decrease number of cigarettes and types of foods to avoid while working to prevent increased blood pressure. Has MD appointment on 07/14/2022.  Given bus passes. Past Medical History: Past Medical History:  Diagnosis Date   Cocaine abuse ([place])    Suicide attempt [facility])    advises has attempted to hang self in past    Encounter Details:  CNP Questionnaire - 06/22/22 1045       Questionnaire   Ask client: Do you give verbal consent for me to treat you today? Yes    Student Assistance N/A    Location Patient [facility]    Visit Setting with Client Organization    Patient Status Research officer, political party or [place]    Insurance/Financial Assistance Referral N/A    Medication Have Medication Insecurities    Medical Provider No    Screening Referrals Made N/A    Medical Referrals Made N/A    Medical Appointment Made N/A    Recently w/o PCP, now 1st time PCP visit completed due to CNs referral or appointment made N/A    Food Have Food Insecurities    Transportation Need transportation assistance;Provided transportation assistance    Housing/Utilities No permanent housing    Interpersonal Safety Do not feel safe at current residence    Interventions Advocate/Support;Counsel;Educate    Abnormal to Normal Screening Since Last CN Visit N/A    Screenings CN Performed Blood Pressure;Pulse Ox    Sent Client to Lab for: N/A    Did client attend any of the following based off CNs referral or appointments made? N/A    ED Visit Averted N/A    Life-Saving Intervention Made N/A

## 2022-06-29 NOTE — Congregational Nurse Program (Signed)
  Dept: [phone]   Congregational Nurse Program Note  Date of Encounter: 06/29/2022  Clinic visit to check blood pressure, BP 130/88, pulse 65, O2 Sat 98%.  Has started new job working until 1:00 AM, able to sleep about 3 hours before breakfast and not able to go to the dormitory area to sleep.  Discussed speaking with [facility] case manager on Thursday on possibility of finding housing sooner to have ability to get more sleep to better manage blood pressure. Past Medical History: Past Medical History:  Diagnosis Date   Cocaine abuse ([employer])    Suicide attempt [facility])    advises has attempted to hang self in past    Encounter Details:  CNP Questionnaire - 06/29/22 1040       Questionnaire   Ask client: Do you give verbal consent for me to treat you today? Yes    Student Assistance N/A    Location Patient [facility]    Visit Setting with Client Organization    Patient Status Research officer, political party or [place]    Insurance/Financial Assistance Referral N/A    Medication Have Medication Insecurities    Medical Provider No    Screening Referrals Made N/A    Medical Referrals Made N/A    Medical Appointment Made N/A    Recently w/o PCP, now 1st time PCP visit completed due to CNs referral or appointment made N/A    Food Have Food Insecurities    Transportation Need transportation assistance    Housing/Utilities No permanent housing    Interpersonal Safety Do not feel safe at current residence    Interventions Advocate/Support;Counsel;Educate;Case Management;Spiritual Care    Abnormal to Normal Screening Since Last CN Visit N/A    Screenings CN Performed Blood Pressure;Pulse Ox    Sent Client to Lab for: N/A    Did client attend any of the following based off CNs referral or appointments made? N/A    ED Visit Averted N/A    Life-Saving Intervention Made N/A

## 2022-06-30 ENCOUNTER — Encounter: Payer: Self-pay | Admitting: Physician Assistant

## 2022-06-30 ENCOUNTER — Encounter: Payer: Self-pay | Admitting: Critical Care Medicine

## 2022-06-30 NOTE — Progress Notes (Signed)
Pt BP has recently been better controlled, was high at first.  Has appt w/ PW 02/14 at 9:30. Labs to be done then.  Has not had a PCP.  He was sharing an apartment w/ a friend, who canceled the lease>> homeless.   He was working for a [employer] but he had a foot problem and was let go.   Had viral URI 01/09 but did not have the money to do the $12 copay for the cough medicine. Was given Mucus DM, has 1200 mg Guaifenisen and dextromethorphan 60 mg x 28 tabs.   Still coughing, but it is dry. No fevers.   Now Works 2nd shift, Freight forwarder. Has to walk home from [employer].  Foot pain is better. Boots were too tight, got better shoes and socks. Still needs foot doctor, toenails are long.  Vitals:   06/30/22 1357  BP: 116/83  Pulse: 71  SpO2: 97%     Rosaria Ferries, PA-C 06/30/2022 2:04 PM

## 2022-07-01 ENCOUNTER — Other Ambulatory Visit: Payer: Self-pay | Admitting: Critical Care Medicine

## 2022-07-01 DIAGNOSIS — B351 Tinea unguium: Secondary | ICD-10-CM

## 2022-07-01 NOTE — Progress Notes (Signed)
[facility]  Has cough present .  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  Has foot pain and has poorly fitting shoes  On exam has calluses and onychomycosis.  Smokes 1PPD  No PCP  Bp 116/73  Plan  PCP at [facility] ref Podiatry ref New shoes and socks given

## 2022-07-13 NOTE — Congregational Nurse Program (Signed)
  Dept: [phone]   Congregational Nurse Program Note  Date of Encounter: 07/13/2022  Clinic visit to check blood pressure, BP 124/83, pulse 70 and regular, O2 Sat 96%.  Diastolic lower but not to goal of 80.  Discussed inability to get consistent hours to sleep due to his work schedule conflict with the hours he can be in the dormitory area of [employer]. Left message for [facility] requesting he be allowed to go to his assigned bed from 11A until 3P when he leaves for work.  Past Medical History: Past Medical History:  Diagnosis Date   Cocaine abuse ([place])    Suicide attempt [facility])    advises has attempted to hang self in past    Encounter Details:  CNP Questionnaire - 07/13/22 0926       Questionnaire   Ask client: Do you give verbal consent for me to treat you today? Yes    Student Assistance N/A    Location Patient [facility]    Visit Setting with Client Organization    Patient Status Research officer, political party or [place]    Insurance/Financial Assistance Referral N/A    Medication Have Medication Insecurities    Medical Provider Yes    Screening Referrals Made N/A    Medical Referrals Made N/A    Medical Appointment Made N/A    Recently w/o PCP, now 1st time PCP visit completed due to CNs referral or appointment made N/A    Food Have Food Insecurities    Transportation Need transportation assistance    Housing/Utilities No permanent housing    Interpersonal Safety Do not feel safe at current residence    Interventions Advocate/Support;Counsel;Educate;Case Management;Spiritual Care    Abnormal to Normal Screening Since Last CN Visit N/A    Screenings CN Performed Blood Pressure;Pulse Ox    Sent Client to Lab for: N/A    Did client attend any of the following based off CNs referral or appointments made? N/A    ED Visit Averted N/A    Life-Saving Intervention Made N/A

## 2022-07-14 ENCOUNTER — Ambulatory Visit: Payer: 59 | Attending: Critical Care Medicine | Admitting: Critical Care Medicine

## 2022-07-14 ENCOUNTER — Other Ambulatory Visit: Payer: Self-pay

## 2022-07-14 ENCOUNTER — Telehealth: Payer: Self-pay | Admitting: Emergency Medicine

## 2022-07-14 ENCOUNTER — Encounter: Payer: Self-pay | Admitting: Critical Care Medicine

## 2022-07-14 VITALS — BP 131/81 | HR 72 | Temp 98.2°F | Ht 69.0 in | Wt 204.0 lb

## 2022-07-14 DIAGNOSIS — R030 Elevated blood-pressure reading, without diagnosis of hypertension: Secondary | ICD-10-CM | POA: Diagnosis not present

## 2022-07-14 DIAGNOSIS — Z23 Encounter for immunization: Secondary | ICD-10-CM | POA: Diagnosis not present

## 2022-07-14 DIAGNOSIS — B351 Tinea unguium: Secondary | ICD-10-CM | POA: Diagnosis not present

## 2022-07-14 DIAGNOSIS — Z1211 Encounter for screening for malignant neoplasm of colon: Secondary | ICD-10-CM

## 2022-07-14 DIAGNOSIS — Z139 Encounter for screening, unspecified: Secondary | ICD-10-CM

## 2022-07-14 DIAGNOSIS — K029 Dental caries, unspecified: Secondary | ICD-10-CM | POA: Insufficient documentation

## 2022-07-14 DIAGNOSIS — H538 Other visual disturbances: Secondary | ICD-10-CM | POA: Insufficient documentation

## 2022-07-14 DIAGNOSIS — F1910 Other psychoactive substance abuse, uncomplicated: Secondary | ICD-10-CM

## 2022-07-14 DIAGNOSIS — J441 Chronic obstructive pulmonary disease with (acute) exacerbation: Secondary | ICD-10-CM | POA: Diagnosis not present

## 2022-07-14 DIAGNOSIS — Z008 Encounter for other general examination: Secondary | ICD-10-CM | POA: Insufficient documentation

## 2022-07-14 DIAGNOSIS — H5462 Unqualified visual loss, left eye, normal vision right eye: Secondary | ICD-10-CM

## 2022-07-14 DIAGNOSIS — F1491 Cocaine use, unspecified, in remission: Secondary | ICD-10-CM

## 2022-07-14 DIAGNOSIS — Z5901 Sheltered homelessness: Secondary | ICD-10-CM

## 2022-07-14 DIAGNOSIS — F17210 Nicotine dependence, cigarettes, uncomplicated: Secondary | ICD-10-CM | POA: Diagnosis not present

## 2022-07-14 DIAGNOSIS — R053 Chronic cough: Secondary | ICD-10-CM

## 2022-07-14 MED ORDER — PREDNISONE 10 MG PO TABS
40.0000 mg | ORAL_TABLET | Freq: Every day | ORAL | 0 refills | Status: AC
  Filled 2022-07-14: qty 20, 5d supply, fill #0

## 2022-07-14 MED ORDER — AZITHROMYCIN 250 MG PO TABS
ORAL_TABLET | ORAL | 0 refills | Status: AC
  Filled 2022-07-14: qty 6, 5d supply, fill #0

## 2022-07-14 MED ORDER — ALBUTEROL SULFATE HFA 108 (90 BASE) MCG/ACT IN AERS
2.0000 | INHALATION_SPRAY | Freq: Four times a day (QID) | RESPIRATORY_TRACT | 2 refills | Status: AC | PRN
  Filled 2022-07-14: qty 6.7, 25d supply, fill #0

## 2022-07-14 NOTE — Patient Instructions (Signed)
Flu vaccine was given  Health screening labs obtained  Referral to dentist and eye doctor made  Take prednisone, albuterol, azithromycin for cough  Chest x-ray will be obtained go to [facility] across the street for this x-ray go in the main entrance and go to the radiology check-in you do not need an appointment  Return to Dr. Joya Gaskins 3 months

## 2022-07-14 NOTE — Telephone Encounter (Signed)
Call pt tell him order in

## 2022-07-14 NOTE — Addendum Note (Signed)
Addended by: Asencion Noble E on: 07/14/2022 04:05 PM   Modules accepted: Orders

## 2022-07-14 NOTE — Progress Notes (Unsigned)
New patient, establish care [facility]

## 2022-07-14 NOTE — Progress Notes (Unsigned)
New Patient Office Visit  Subjective    Patient ID: Ruben Mitchell, male    DOB: August 17, 1965  Age: 57 y.o. MRN: [id number]  CC:  Chief Complaint  Patient presents with   New Patient (Initial Visit)    HPI Ruben Mitchell presents to establish care [place] resident new patient  [facility]  Has cough present .  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  Has foot pain and has poorly fitting shoes  On exam has calluses and onychomycosis.  Smokes 1PPD  No PCP  Bp 116/73  Plan  PCP at [facility] ref Podiatry ref New shoes and socks given  Outpatient Encounter Medications as of 07/14/2022  Medication Sig   [DISCONTINUED] acetaminophen (TYLENOL) 500 MG tablet Take 1 tablet (500 mg total) by mouth every 6 (six) hours as needed for moderate pain or fever. (Patient not taking: Reported on 06/14/2022)   [DISCONTINUED] promethazine-dextromethorphan (PROMETHAZINE-DM) 6.25-15 MG/5ML syrup Take 5 mLs by mouth 4 (four) times daily as needed for cough. (Patient not taking: Reported on 06/14/2022)   No facility-administered encounter medications on file as of 07/14/2022.    Past Medical History:  Diagnosis Date   Cocaine abuse ([place])    Suicide attempt [facility])    advises has attempted to hang self in past    No past surgical history on file.  No family history on file.  Social History   Socioeconomic History   Marital status: Single    Spouse name: Not on file   Number of children: Not on file   Years of education: Not on file   Highest education level: Not on file  Occupational History   Not on file  Tobacco Use   Smoking status: Every Day    Packs/day: 0.50    Types: Cigarettes   Smokeless tobacco: Not on file  Substance and Sexual Activity   Alcohol use: No   Drug use: Yes    Frequency: 2.0 times per week    Types: Cocaine, Marijuana   Sexual activity: Not on file  Other Topics Concern    Not on file  Social History Narrative   Not on file   Social Determinants of Health   Financial Resource Strain: Not on file  Food Insecurity: Not on file  Transportation Needs: Not on file  Physical Activity: Not on file  Stress: Not on file  Social Connections: Not on file  Intimate Partner Violence: Not on file    Review of Systems  Constitutional:  Negative for chills, diaphoresis, fever, malaise/fatigue and weight loss.  HENT:  Negative for congestion, ear discharge, ear pain, hearing loss, nosebleeds, sore throat and tinnitus.   Eyes:  Positive for blurred vision. Negative for double vision, photophobia, pain, discharge and redness.  Respiratory:  Positive for cough, sputum production, shortness of breath and wheezing. Negative for hemoptysis and stridor.         excess mucus yellow  Cardiovascular:  Negative for chest pain, palpitations, orthopnea, claudication, leg swelling and PND.  Gastrointestinal:  Negative for abdominal pain, blood in stool, constipation, diarrhea, heartburn, melena, nausea and vomiting.  Genitourinary:  Negative for dysuria, flank pain, frequency, hematuria and urgency.  Musculoskeletal:  Positive for neck pain. Negative for back pain, falls, joint pain and myalgias.  Skin:  Negative for itching and rash.  Neurological:  Negative for dizziness, tingling, tremors, sensory change, speech change, focal weakness,  seizures, loss of consciousness, weakness and headaches.  Endo/Heme/Allergies:  Negative for environmental allergies and polydipsia. Does not bruise/bleed easily.  Psychiatric/Behavioral:  Negative for depression, hallucinations, memory loss, substance abuse and suicidal ideas. The patient is nervous/anxious. The patient does not have insomnia.   All other systems reviewed and are negative.       Objective    BP 131/81 (BP Location: Left Arm, Patient Position: Sitting, Cuff Size: Large)   Pulse 72   Temp 98.2 F (36.8 C) (Oral)   Ht 5'  9" (1.753 m)   Wt 204 lb (92.5 kg)   SpO2 97%   BMI 30.13 kg/m   Physical Exam  {Labs (Optional):23779}    Assessment & Plan:   Problem List Items Addressed This Visit   None   No follow-ups on file.   Asencion Noble, MD

## 2022-07-14 NOTE — Telephone Encounter (Signed)
Copied from [place]. Topic: General - Other >> Jul 14, 2022  2:27 PM Chapman Fitch wrote: Reason for CRM: [facility] Radiology never received chest xray orders so pt wasn't able to get xray and will go back tomorrow / please advise when orders are placed

## 2022-07-15 DIAGNOSIS — J441 Chronic obstructive pulmonary disease with (acute) exacerbation: Secondary | ICD-10-CM | POA: Insufficient documentation

## 2022-07-15 DIAGNOSIS — J4489 Other specified chronic obstructive pulmonary disease: Secondary | ICD-10-CM | POA: Insufficient documentation

## 2022-07-15 DIAGNOSIS — F1910 Other psychoactive substance abuse, uncomplicated: Secondary | ICD-10-CM | POA: Insufficient documentation

## 2022-07-15 DIAGNOSIS — H5462 Unqualified visual loss, left eye, normal vision right eye: Secondary | ICD-10-CM | POA: Insufficient documentation

## 2022-07-15 DIAGNOSIS — K029 Dental caries, unspecified: Secondary | ICD-10-CM | POA: Insufficient documentation

## 2022-07-15 DIAGNOSIS — F1911 Other psychoactive substance abuse, in remission: Secondary | ICD-10-CM | POA: Insufficient documentation

## 2022-07-15 LAB — COMPREHENSIVE METABOLIC PANEL
ALT: 18 IU/L (ref 0–44)
AST: 24 IU/L (ref 0–40)
Albumin/Globulin Ratio: 2.1 (ref 1.2–2.2)
Albumin: 4.1 g/dL (ref 3.8–4.9)
Alkaline Phosphatase: 80 IU/L (ref 44–121)
BUN/Creatinine Ratio: 11 (ref 9–20)
BUN: 11 mg/dL (ref 6–24)
Bilirubin Total: <0.2 mg/dL (ref 0.0–1.2)
CO2: 20 mmol/L (ref 20–29)
Calcium: 8.8 mg/dL (ref 8.7–10.2)
Chloride: 107 mmol/L — ABNORMAL HIGH (ref 96–106)
Creatinine, Ser: 1.02 mg/dL (ref 0.76–1.27)
Globulin, Total: 2 g/dL (ref 1.5–4.5)
Glucose: 90 mg/dL (ref 70–99)
Potassium: 4.3 mmol/L (ref 3.5–5.2)
Sodium: 141 mmol/L (ref 134–144)
Total Protein: 6.1 g/dL (ref 6.0–8.5)
eGFR: 85 mL/min/{1.73_m2} (ref >59)

## 2022-07-15 LAB — CBC WITH DIFFERENTIAL/PLATELET
Basophils Absolute: 0 10*3/uL (ref 0.0–0.2)
Basos: 0 %
EOS (ABSOLUTE): 0.1 10*3/uL (ref 0.0–0.4)
Eos: 2 %
Hematocrit: 42.9 % (ref 37.5–51.0)
Hemoglobin: 13.7 g/dL (ref 13.0–17.7)
Immature Grans (Abs): 0 10*3/uL (ref 0.0–0.1)
Immature Granulocytes: 0 %
Lymphocytes Absolute: 1 10*3/uL (ref 0.7–3.1)
Lymphs: 17 %
MCH: 26.5 pg — ABNORMAL LOW (ref 26.6–33.0)
MCHC: 31.9 g/dL (ref 31.5–35.7)
MCV: 83 fL (ref 79–97)
Monocytes Absolute: 0.6 10*3/uL (ref 0.1–0.9)
Monocytes: 11 %
Neutrophils Absolute: 3.9 10*3/uL (ref 1.4–7.0)
Neutrophils: 70 %
Platelets: 243 10*3/uL (ref 150–450)
RBC: 5.17 x10E6/uL (ref 4.14–5.80)
RDW: 13.7 % (ref 11.6–15.4)
WBC: 5.7 10*3/uL (ref 3.4–10.8)

## 2022-07-15 LAB — LIPID PANEL
Chol/HDL Ratio: 3.7 ratio (ref 0.0–5.0)
Cholesterol, Total: 161 mg/dL (ref 100–199)
HDL: 44 mg/dL (ref >39)
LDL Chol Calc (NIH): 96 mg/dL (ref 0–99)
Triglycerides: 116 mg/dL (ref 0–149)
VLDL Cholesterol Cal: 21 mg/dL (ref 5–40)

## 2022-07-15 LAB — HCV AB W REFLEX TO QUANT PCR: HCV Ab: NONREACTIVE

## 2022-07-15 LAB — HCV INTERPRETATION

## 2022-07-15 LAB — HEMOGLOBIN A1C
Est. average glucose Bld gHb Est-mCnc: 128 mg/dL
Hgb A1c MFr Bld: 6.1 % — ABNORMAL HIGH (ref 4.8–5.6)

## 2022-07-15 NOTE — Assessment & Plan Note (Signed)
    Current smoking consumption amount: 1 pack a day  Dicsussion on advise to quit smoking and smoking impacts: Lung and cardiovascular impact  Patient's willingness to quit: Not able to quit now in the shelter  Methods to quit smoking discussed: Behavioral modification  Medication management of smoking session drugs discussed: Nicotine replacement  Resources provided:  AVS   Setting quit date not established  Follow-up arranged 2 months   Time spent counseling the patient:   5 minutes

## 2022-07-15 NOTE — Assessment & Plan Note (Signed)
Patient has insurance dental referral made

## 2022-07-15 NOTE — Assessment & Plan Note (Signed)
History of elevated blood pressure however today is in normal range we will hold on therapy

## 2022-07-15 NOTE — Assessment & Plan Note (Signed)
Patient to continue albuterol and inhaled medications we will see with a pulse dose of prednisone and azithromycin  Counseled to reduce tobacco intake  Obtain chest x-ray

## 2022-07-15 NOTE — Assessment & Plan Note (Signed)
Former cocaine use now in remission

## 2022-07-15 NOTE — Assessment & Plan Note (Signed)
Patient has vision loss left eye will refer to optometry

## 2022-07-15 NOTE — Assessment & Plan Note (Signed)
In remission.

## 2022-07-15 NOTE — Assessment & Plan Note (Signed)
Currently in [place]

## 2022-07-15 NOTE — Progress Notes (Signed)
Let patient know liver and kidneys normal blood counts normal he has what we call prediabetes he needs to follow a low carbohydrate healthy diet as best he can in the shelter no medications needed hepatitis C negative cholesterol is normal apologized to him about the chest x-ray issue the order is in place he can return to [facility] at his convenience it is not urgent and he does not need an appointment

## 2022-07-16 ENCOUNTER — Telehealth: Payer: Self-pay

## 2022-07-16 ENCOUNTER — Ambulatory Visit (INDEPENDENT_AMBULATORY_CARE_PROVIDER_SITE_OTHER): Payer: 59 | Admitting: Podiatry

## 2022-07-16 VITALS — BP 131/81

## 2022-07-16 DIAGNOSIS — B351 Tinea unguium: Secondary | ICD-10-CM

## 2022-07-16 DIAGNOSIS — M79675 Pain in left toe(s): Secondary | ICD-10-CM

## 2022-07-16 DIAGNOSIS — M79674 Pain in right toe(s): Secondary | ICD-10-CM

## 2022-07-16 NOTE — Telephone Encounter (Signed)
-----   Message from Elsie Stain, MD sent at 07/15/2022  5:41 AM EST ----- Let patient know liver and kidneys normal blood counts normal he has what we call prediabetes he needs to follow a low carbohydrate healthy diet as best he can in the shelter no medications needed hepatitis C negative cholesterol is normal apologized to him about the chest x-ray issue the order is in place he can return to [facility] at his convenience it is not urgent and he does not need an appointment

## 2022-07-16 NOTE — Progress Notes (Signed)
   Chief Complaint  Patient presents with   Nail Problem    Patient came in today for routine foot care, nail trim,     SUBJECTIVE Patient presents to office today complaining of elongated, thickened nails that cause pain while ambulating in shoes.  Patient is unable to trim their own nails. Patient is here for further evaluation and treatment.  Past Medical History:  Diagnosis Date   Cocaine abuse ([place])    Suicide attempt [facility])    advises has attempted to hang self in past    Allergies  Allergen Reactions   Aspirin Other (See Comments)    unknown   Penicillins Other (See Comments)    unknown     OBJECTIVE General Patient is awake, alert, and oriented x 3 and in no acute distress. Derm Skin is dry and supple bilateral. Negative open lesions or macerations. Remaining integument unremarkable. Nails are tender, long, thickened and dystrophic with subungual debris, consistent with onychomycosis, 1-5 bilateral. No signs of infection noted. Vasc  DP and PT pedal pulses palpable bilaterally. Temperature gradient within normal limits.  Neuro Epicritic and protective threshold sensation grossly intact bilaterally.  Musculoskeletal Exam No symptomatic pedal deformities noted bilateral. Muscular strength within normal limits.  ASSESSMENT 1.  Pain due to onychomycosis of toenails both  PLAN OF CARE 1. Patient evaluated today.  2. Instructed to maintain good pedal hygiene and foot care.  3. Mechanical debridement of nails 1-5 bilaterally performed using a nail nipper. Filed with dremel without incident.  4. Return to clinic as needed   Edrick Kins, DPM [facility]  Dr. Edrick Kins, DPM    [address], [place] [address]                Office [phone]  Fax [phone]

## 2022-07-16 NOTE — Telephone Encounter (Signed)
Called patient unable to make contact or leave  vm

## 2022-07-19 ENCOUNTER — Ambulatory Visit (HOSPITAL_COMMUNITY)
Admission: RE | Admit: 2022-07-19 | Discharge: 2022-07-19 | Disposition: A | Discharge status: Home / Self Care | Payer: 59 | Source: Ambulatory Visit | Attending: Critical Care Medicine | Admitting: Critical Care Medicine

## 2022-07-19 DIAGNOSIS — R059 Cough, unspecified: Secondary | ICD-10-CM | POA: Diagnosis not present

## 2022-07-19 DIAGNOSIS — R053 Chronic cough: Secondary | ICD-10-CM | POA: Insufficient documentation

## 2022-07-19 DIAGNOSIS — F17210 Nicotine dependence, cigarettes, uncomplicated: Secondary | ICD-10-CM | POA: Diagnosis not present

## 2022-07-19 DIAGNOSIS — R0602 Shortness of breath: Secondary | ICD-10-CM | POA: Diagnosis not present

## 2022-07-20 NOTE — Congregational Nurse Program (Signed)
  Dept: [phone]   Congregational Nurse Program Note  Date of Encounter: 07/20/2022  Clinic visit for blood pressure check, BP 132/85, pulse 66 and regular, O2 Sat 96%.  Reviewed medications, some of laboratory results from MD visit and blood pressure medications.  Educated regarding A1C level and lipids and dietary choices to maintain within normal levels. Discussed strategies to stop smoking.  Past Medical History: Past Medical History:  Diagnosis Date   Cocaine abuse ([place])    Suicide attempt [facility])    advises has attempted to hang self in past    Encounter Details:  CNP Questionnaire - 07/20/22 0930       Questionnaire   Ask client: Do you give verbal consent for me to treat you today? Yes    Student Assistance N/A    Location Patient [facility]    Visit Setting with Client Organization    Patient Status Research officer, political party or [place]    Insurance/Financial Assistance Referral N/A    Medication Have Medication Insecurities    Medical Provider Yes    Screening Referrals Made N/A    Medical Referrals Made N/A    Medical Appointment Made N/A    Recently w/o PCP, now 1st time PCP visit completed due to CNs referral or appointment made N/A    Food Have Food Insecurities    Transportation Need transportation assistance    Housing/Utilities No permanent housing    Interpersonal Safety Do not feel safe at current residence    Interventions Advocate/Support;Counsel;Educate;Spiritual Care;Reviewed Medications    Abnormal to Normal Screening Since Last CN Visit N/A    Screenings CN Performed Blood Pressure;Pulse Ox    Sent Client to Lab for: N/A    Did client attend any of the following based off CNs referral or appointments made? N/A    ED Visit Averted N/A    Life-Saving Intervention Made N/A

## 2022-07-21 ENCOUNTER — Telehealth: Payer: Self-pay

## 2022-07-21 NOTE — Progress Notes (Signed)
Let pt know CXR normal  no lung cancer

## 2022-07-21 NOTE — Telephone Encounter (Signed)
-----   Message from Elsie Stain, MD sent at 07/21/2022  6:11 AM EST ----- Let pt know CXR normal  no lung cancer

## 2022-07-21 NOTE — Telephone Encounter (Signed)
Pt was called and is aware of results, DOB was confirmed.  ?

## 2022-10-14 ENCOUNTER — Ambulatory Visit: Payer: 59 | Admitting: Critical Care Medicine

## 2022-10-14 NOTE — Progress Notes (Deleted)
New Patient Office Visit  Subjective    Patient ID: Ruben Mitchell, male    DOB: Oct 11, 1965  Age: 57 y.o. MRN: [id number]  CC:  No chief complaint on file.   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  He was seen end of January with a cough and foot pain with severe onychomycosis of the feet and calluses.  He smokes a pack a day of cigarettes.  He does not have a primary care provider.  He works second shift 4:30 PM to 1 AM and has to walk an hour to and from work from the [employer] city location of the shelter to the [employer]. location as a Museum/gallery exhibitions officer.  He is trying to find work closer to the shelter.  Patient needs a flu vaccine and agrees to receive this.  Patient notes decreased vision in the left eye and also has severe dental caries.  There is a history of suicidality in the past he is not currently suicidal and also history of cocaine use he is no longer using cocaine. Patient has upcoming podiatry referral.  10/14/22  Outpatient Encounter Medications as of 10/14/2022  Medication Sig   albuterol (VENTOLIN HFA) 108 (90 Base) MCG/ACT inhaler Inhale 2 puffs into the lungs every 6 (six) hours as needed for wheezing or shortness of breath.   No facility-administered encounter medications on file as of 10/14/2022.    Past Medical History:  Diagnosis Date   Cocaine abuse ([facility])    Suicide attempt [facility])    advises has attempted to hang self in past    Past Surgical History:  Procedure Laterality Date   fractured right forearm Left     No family history on file.  Social History   Socioeconomic History   Marital status: Single    Spouse name: Not on file   Number of children: Not on file   Years of education: Not on file   Highest education level: Not on file  Occupational History   Not on file  Tobacco Use   Smoking status: Every Day    Packs/day: .5    Types:  Cigarettes   Smokeless tobacco: Not on file  Substance and Sexual Activity   Alcohol use: No   Drug use: Yes    Frequency: 2.0 times per week    Types: Cocaine, Marijuana   Sexual activity: Not on file  Other Topics Concern   Not on file  Social History Narrative   Not on file   Social Determinants of Health   Financial Resource Strain: Not on file  Food Insecurity: Not on file  Transportation Needs: Not on file  Physical Activity: Not on file  Stress: Not on file  Social Connections: Not on file  Intimate Partner Violence: Not on file    Review of Systems  Constitutional:  Negative for chills, diaphoresis, fever, malaise/fatigue and weight loss.  HENT:  Negative for congestion, ear discharge, ear pain, hearing loss, nosebleeds, sore throat and tinnitus.   Eyes:  Positive for blurred vision. Negative for double vision, photophobia, pain, discharge and redness.  Respiratory:  Positive for cough, sputum production, shortness of breath and wheezing. Negative for hemoptysis and stridor.         excess mucus yellow  Cardiovascular:  Negative for chest pain, palpitations, orthopnea, claudication, leg swelling and PND.  Gastrointestinal:  Negative for abdominal pain, blood in  stool, constipation, diarrhea, heartburn, melena, nausea and vomiting.  Genitourinary:  Negative for dysuria, flank pain, frequency, hematuria and urgency.  Musculoskeletal:  Positive for neck pain. Negative for back pain, falls, joint pain and myalgias.  Skin:  Negative for itching and rash.  Neurological:  Negative for dizziness, tingling, tremors, sensory change, speech change, focal weakness, seizures, loss of consciousness, weakness and headaches.  Endo/Heme/Allergies:  Negative for environmental allergies and polydipsia. Does not bruise/bleed easily.  Psychiatric/Behavioral:  Negative for depression, hallucinations, memory loss, substance abuse and suicidal ideas. The patient is nervous/anxious. The patient  does not have insomnia.   All other systems reviewed and are negative.       Objective    There were no vitals taken for this visit.  Physical Exam Vitals reviewed.  Constitutional:      Appearance: Normal appearance. He is well-developed. He is not diaphoretic.  HENT:     Head: Normocephalic and atraumatic.     Nose: No nasal deformity, septal deviation, mucosal edema or rhinorrhea.     Right Sinus: No maxillary sinus tenderness or frontal sinus tenderness.     Left Sinus: No maxillary sinus tenderness or frontal sinus tenderness.     Mouth/Throat:     Pharynx: No oropharyngeal exudate.     Comments: Poor dentition Eyes:     General: No scleral icterus.    Conjunctiva/sclera: Conjunctivae normal.     Pupils: Pupils are equal, round, and reactive to light.  Neck:     Thyroid: No thyromegaly.     Vascular: No carotid bruit or JVD.     Trachea: Trachea normal. No tracheal tenderness or tracheal deviation.  Cardiovascular:     Rate and Rhythm: Normal rate and regular rhythm.     Chest Wall: PMI is not displaced.     Pulses: Normal pulses. No decreased pulses.     Heart sounds: Normal heart sounds, S1 normal and S2 normal. Heart sounds not distant. No murmur heard.    No systolic murmur is present.     No diastolic murmur is present.     No friction rub. No gallop. No S3 or S4 sounds.  Pulmonary:     Effort: Pulmonary effort is normal. No tachypnea, accessory muscle usage or respiratory distress.     Breath sounds: No stridor. Wheezing present. No decreased breath sounds, rhonchi or rales.  Chest:     Chest wall: No tenderness.  Abdominal:     General: Bowel sounds are normal. There is no distension.     Palpations: Abdomen is soft. Abdomen is not rigid.     Tenderness: There is no abdominal tenderness. There is no guarding or rebound.  Musculoskeletal:        General: Normal range of motion.     Cervical back: Normal range of motion and neck supple. No edema, erythema  or rigidity. No muscular tenderness. Normal range of motion.     Comments: Severe onychomycosis both feet  Lymphadenopathy:     Head:     Right side of head: No submental or submandibular adenopathy.     Left side of head: No submental or submandibular adenopathy.     Cervical: No cervical adenopathy.  Skin:    General: Skin is warm and dry.     Coloration: Skin is not pale.     Findings: No rash.     Nails: There is no clubbing.  Neurological:     Mental Status: He is alert and oriented to person,  place, and time.     Sensory: No sensory deficit.  Psychiatric:        Speech: Speech normal.        Behavior: Behavior normal.         Assessment & Plan:   Problem List Items Addressed This Visit   None Flu vaccine was given Screening labs obtained colon cancer screening test obtained  No follow-ups on file.   Shan Levans, MD

## 2022-10-24 ENCOUNTER — Other Ambulatory Visit: Payer: Self-pay

## 2022-10-24 ENCOUNTER — Emergency Department (HOSPITAL_COMMUNITY)
Admission: EM | Admit: 2022-10-24 | Discharge: 2022-10-24 | Disposition: A | Discharge status: Home / Self Care | Payer: 59 | Attending: Emergency Medicine | Admitting: Emergency Medicine

## 2022-10-24 ENCOUNTER — Emergency Department (HOSPITAL_COMMUNITY): Payer: 59

## 2022-10-24 ENCOUNTER — Encounter (HOSPITAL_COMMUNITY): Payer: Self-pay

## 2022-10-24 DIAGNOSIS — R0789 Other chest pain: Secondary | ICD-10-CM | POA: Diagnosis not present

## 2022-10-24 DIAGNOSIS — R7989 Other specified abnormal findings of blood chemistry: Secondary | ICD-10-CM | POA: Insufficient documentation

## 2022-10-24 DIAGNOSIS — F17210 Nicotine dependence, cigarettes, uncomplicated: Secondary | ICD-10-CM | POA: Insufficient documentation

## 2022-10-24 DIAGNOSIS — J029 Acute pharyngitis, unspecified: Secondary | ICD-10-CM | POA: Diagnosis not present

## 2022-10-24 DIAGNOSIS — J069 Acute upper respiratory infection, unspecified: Secondary | ICD-10-CM | POA: Diagnosis not present

## 2022-10-24 DIAGNOSIS — R9431 Abnormal electrocardiogram [ECG] [EKG]: Secondary | ICD-10-CM | POA: Diagnosis not present

## 2022-10-24 DIAGNOSIS — Z1152 Encounter for screening for COVID-19: Secondary | ICD-10-CM | POA: Insufficient documentation

## 2022-10-24 DIAGNOSIS — B9789 Other viral agents as the cause of diseases classified elsewhere: Secondary | ICD-10-CM | POA: Diagnosis not present

## 2022-10-24 LAB — RESP PANEL BY RT-PCR (RSV, FLU A&B, COVID)  RVPGX2
Influenza A by PCR: NEGATIVE
Influenza B by PCR: NEGATIVE
Resp Syncytial Virus by PCR: NEGATIVE
SARS Coronavirus 2 by RT PCR: NEGATIVE

## 2022-10-24 LAB — CBC
HCT: 46.1 % (ref 39.0–52.0)
Hemoglobin: 14.6 g/dL (ref 13.0–17.0)
MCH: 26.8 pg (ref 26.0–34.0)
MCHC: 31.7 g/dL (ref 30.0–36.0)
MCV: 84.7 fL (ref 80.0–100.0)
Platelets: 224 10*3/uL (ref 150–400)
RBC: 5.44 MIL/uL (ref 4.22–5.81)
RDW: 13.5 % (ref 11.5–15.5)
WBC: 8 10*3/uL (ref 4.0–10.5)
nRBC: 0 % (ref 0.0–0.2)

## 2022-10-24 LAB — BASIC METABOLIC PANEL
Anion gap: 9 (ref 5–15)
BUN: 15 mg/dL (ref 6–20)
CO2: 21 mmol/L — ABNORMAL LOW (ref 22–32)
Calcium: 8.3 mg/dL — ABNORMAL LOW (ref 8.9–10.3)
Chloride: 106 mmol/L (ref 98–111)
Creatinine, Ser: 1.28 mg/dL — ABNORMAL HIGH (ref 0.61–1.24)
GFR, Estimated: >60 mL/min (ref >60)
Glucose, Bld: 88 mg/dL (ref 70–99)
Potassium: 5 mmol/L (ref 3.5–5.1)
Sodium: 136 mmol/L (ref 135–145)

## 2022-10-24 LAB — TROPONIN I (HIGH SENSITIVITY): Troponin I (High Sensitivity): 4 ng/L (ref <18)

## 2022-10-24 MED ORDER — DOXYCYCLINE HYCLATE 100 MG PO CAPS
100.0000 mg | ORAL_CAPSULE | Freq: Two times a day (BID) | ORAL | 0 refills | Status: DC
  Filled 2022-10-26: qty 14, 7d supply, fill #0

## 2022-10-24 MED ORDER — AEROCHAMBER PLUS FLO-VU MEDIUM MISC
1.0000 | Freq: Once | Status: AC
  Administered 2022-10-24: 1
  Filled 2022-10-24: qty 10

## 2022-10-24 MED ORDER — ALBUTEROL SULFATE HFA 108 (90 BASE) MCG/ACT IN AERS
2.0000 | INHALATION_SPRAY | Freq: Once | RESPIRATORY_TRACT | Status: AC
  Administered 2022-10-24: 2 via RESPIRATORY_TRACT
  Filled 2022-10-24: qty 6.7

## 2022-10-24 MED ORDER — SODIUM CHLORIDE 0.9 % IV BOLUS
500.0000 mL | Freq: Once | INTRAVENOUS | Status: AC
  Administered 2022-10-24: 500 mL via INTRAVENOUS

## 2022-10-24 NOTE — ED Provider Notes (Cosign Needed Addendum)
[place] EMERGENCY DEPARTMENT AT [facility] Provider Note   CSN: [id number] Arrival date & time: 10/24/22  0915     History  Chief Complaint  Patient presents with   Generalized Body Aches    Ruben Mitchell is Mitchell 57 y.o. male who reports himself as otherwise healthy no daily medication use provide daily smoker since age 8, down to 3 cigarettes Mitchell week.  Patient presented for evaluation of rhinorrhea, nasal congestion, sore throat and cough that is been ongoing for around 1 week.  He has tried TheraFlu with minimal relief.  Around the same time he reports that he noticed some bodyaches to his entire body, aching is mild and without aggravating or alleviating factors.  He does report over the past few days he has noticed Mitchell yellow sputum when he coughs, this is new for him.  Patient reports that over the past 2-3 days he has noticed Mitchell mild chest tightness mostly when he coughs and improves with rest.  Pain does not radiate.  No aggravating or alleviating factors.  He denies measuring fever at home, he denies hemoptysis, history of DVT, history of cancer, exogenous hormone use, recent surgery/immobilization, abdominal pain, nausea, vomiting, diarrhea or additional concerns.  HPI     Home Medications Prior to Admission medications   Medication Sig Start Date End Date Taking? Authorizing Provider  doxycycline (VIBRAMYCIN) 100 MG capsule Take 1 capsule (100 mg total) by mouth 2 (two) times daily for 7 days. 10/24/22 10/31/22 Yes Ruben Salts A, PA-C  albuterol (VENTOLIN HFA) 108 (90 Base) MCG/ACT inhaler Inhale 2 puffs into the lungs every 6 (six) hours as needed for wheezing or shortness of breath. 07/14/22   Storm Frisk, MD      Allergies    Aspirin and Penicillins    Review of Systems   Review of Systems Ten systems are reviewed and are negative for acute change except as noted in the HPI  Physical Exam Updated Vital Signs BP 113/69   Pulse 72   Temp 98.7 F  (37.1 C) (Oral)   Resp 18   Ht 5\' 9"  (1.753 m)   Wt 90.7 kg   SpO2 97%   BMI 29.53 kg/m  Physical Exam Constitutional:      General: He is not in acute distress.    Appearance: Normal appearance. He is well-developed. He is not ill-appearing or diaphoretic.  HENT:     Head: Normocephalic and atraumatic.  Eyes:     General: Vision grossly intact. Gaze aligned appropriately.     Pupils: Pupils are equal, round, and reactive to light.  Neck:     Trachea: Trachea and phonation normal.  Cardiovascular:     Rate and Rhythm: Normal rate and regular rhythm.     Pulses: Normal pulses.          Radial pulses are 2+ on the right side and 2+ on the left side.       Dorsalis pedis pulses are 2+ on the right side and 2+ on the left side.     Heart sounds: Normal heart sounds.  Pulmonary:     Effort: Pulmonary effort is normal. No accessory muscle usage or respiratory distress.     Breath sounds: Normal air entry. Wheezing present.     Comments: Mild expiratory wheezing Abdominal:     General: There is no distension.     Palpations: Abdomen is soft.     Tenderness: There is no abdominal tenderness. There is  no guarding or rebound.  Musculoskeletal:        General: Normal range of motion.     Cervical back: Normal range of motion.     Right lower leg: No edema.     Left lower leg: No edema.  Skin:    General: Skin is warm and dry.  Neurological:     Mental Status: He is alert.     GCS: GCS eye subscore is 4. GCS verbal subscore is 5. GCS motor subscore is 6.     Comments: Speech is clear and goal oriented, follows commands Major Cranial nerves without deficit, no facial droop Moves extremities without ataxia, coordination intact  Psychiatric:        Behavior: Behavior normal.     ED Results / Procedures / Treatments   Labs (all labs ordered are listed, but only abnormal results are displayed) Labs Reviewed  BASIC METABOLIC PANEL - Abnormal; Notable for the following components:       Result Value   CO2 21 (*)    Creatinine, Ser 1.28 (*)    Calcium 8.3 (*)    All other components within normal limits  RESP PANEL BY RT-PCR (RSV, FLU Mitchell&B, COVID)  [id number]  CBC  TROPONIN I (HIGH SENSITIVITY)    EKG EKG Interpretation  Date/Time:  Sunday Oct 24 2022 10:58:45 EDT Ventricular Rate:  68 PR Interval:  186 QRS Duration: 82 QT Interval:  405 QTC Calculation: 431 R Axis:   -39 Text Interpretation: Sinus rhythm Consider right atrial enlargement Left axis deviation No previous ECGs available Confirmed by Ruben Mitchell ([id number]) on 10/24/2022 11:11:33 AM  Radiology DG Chest 2 View  Result Date: 10/24/2022 CLINICAL DATA:  Upper respiratory infection for 1 week. Chest tightness for 2 days. EXAM: CHEST - 2 VIEW COMPARISON:  Chest two views 07/19/2022 FINDINGS: Cardiac silhouette and mediastinal contours are within normal limits. The lungs are clear. No pleural effusion or pneumothorax. No acute skeletal abnormality. IMPRESSION: No active cardiopulmonary disease. Electronically Signed   By: Neita Garnet M.D.   On: 10/24/2022 10:50    Procedures Procedures    Medications Ordered in ED Medications  sodium chloride 0.9 % bolus 500 mL (0 mLs Intravenous Stopped 10/24/22 1328)  albuterol (VENTOLIN HFA) 108 (90 Base) MCG/ACT inhaler 2 puff (2 puffs Inhalation Given 10/24/22 1230)  AeroChamber Plus Flo-Vu Medium MISC 1 each (1 each Other Given 10/24/22 1331)    ED Course/ Medical Decision Making/ Mitchell&P                             Medical Decision Making 57 year old male reports is otherwise healthy no today medication use but long history of smoking since age 20, down to 3 cigarettes Mitchell day.  Patient presented for 1 week of URI symptoms with rhinorrhea, nasal congestion, sore throat, body aches and cough with productive yellow sputum.  He reports Mitchell mild chest tightness mostly when he coughs over the past few days.  He denies any additional concerns.  On evaluation he is  well-appearing and in no acute distress, cranial nerves intact.  Airway clear.  Mild rhinorrhea present on exam.  No meningismus.  Heart regular rate and rhythm.  Mild expiratory wheezing noted on auscultation.  Abdomen soft nontender.  Patient events contact well for extremities without evidence of DVT.  Differential includes but not limited to viral URI, pneumonia, COPD, asthma, ACS.  Less suspicion for PE, dissection or PTX.  Plan to obtain  CBC, BMP, troponin, EKG and chest x-ray.  Will give IV fluids along with albuterol inhaler and monitor.  Amount and/or Complexity of Data Reviewed Labs: ordered.    Details: CBC within normal limits, no leukocytosis to suggest acute bacterial process.  No anemia or thrombocytopenia. Troponin within normal limits, with chest tightness onset over 2 days ago there is no indication for delta troponin.  Low suspicion for ACS at this time. BMP shows mild elevation of creatinine at 1.28.  No emergent electrolyte derangement or gap.  Suspect some degree of dehydration, IV fluids given. COVID/influenza and RSV test negative. Radiology: ordered.    Details: I have personally reviewed and interpreted patient's two-view chest x-ray.  I do not appreciate any obvious PTX, PNA or effusion. ECG/medicine tests:     Details: I personally reviewed patient's twelve-lead EKG, I do not appreciate any obvious acute ischemic changes.  Shows normal sinus rhythm.  Risk Prescription drug management. Risk Details: Patient reassessed he is resting comfortably bed no acute distress.  On auscultation his wheezing has completely resolved following 2 puffs of albuterol.  I suspect patient is experiencing Mitchell viral URI at this time.  He is Mitchell longtime smoker, he has no diagnosis of COPD at this time however I do suspect he has some underlying COPD, will treat as acute exacerbation of chronic bronchitis with doxycycline 100 mg twice daily x 7 days.  Patient encouraged to follow-up with his PCP Dr.  Delford Field this week for recheck.  He will take the albuterol inhaler home with him to use as needed, strict ER precautions were discussed.  Labs reviewed did show some elevation of creatinine which I suspect is due to some dehydration he is experiencing, IV fluids given, encouraged adequate p.o. intake and recheck by PCP.  I have low suspicion for ACS, PE, dissection, meningitis, PNA or other emergent pathologies at this time.  Patient appears stable for discharge with PCP follow-up.  Vital signs stable on room air.  At this time there does not appear to be any evidence of an acute emergency medical condition and the patient appears stable for discharge with appropriate outpatient follow up. Diagnosis was discussed with patient who verbalizes understanding of care plan and is agreeable to discharge. I have discussed return precautions with patient who verbalizes understanding. Patient encouraged to follow-up with their PCP. All questions answered.  Patient's case discussed with Dr. Elpidio Anis who agrees with plan to discharge with follow-up.   Note: Portions of this report may have been transcribed using voice recognition software. Every effort was made to ensure accuracy; however, inadvertent computerized transcription errors may still be present.         Final Clinical Impression(s) / ED Diagnoses Final diagnoses:  Viral upper respiratory tract infection    Rx / DC Orders ED Discharge Orders          Ordered    doxycycline (VIBRAMYCIN) 100 MG capsule  2 times daily        10/24/22 1410              Elizabeth Palau 10/24/22 1412    Bill Salinas, PA-C 10/24/22 1413    Mardene Sayer, MD 10/25/22 1040

## 2022-10-24 NOTE — Discharge Instructions (Addendum)
At this time there does not appear to be the presence of an emergent medical condition, however there is always the potential for conditions to change. Please read and follow the below instructions.  Please return to the Emergency Department immediately for any new or worsening symptoms or if your symptoms do not improve within 3 days. Please be sure to follow up with your Primary Care Provider within one week regarding your visit today; please call their office to schedule an appointment even if you are feeling better for a follow-up visit. Please take your antibiotic Doxycycline as prescribed until complete to help with your symptoms.  Please drink enough water to avoid dehydration and get plenty of rest. You may use the albuterol inhaler given to you today to help with any wheezing that you experience.  If you develop any wheezing or any trouble breathing that does not get better quickly after using the albuterol inhaler please return to the emergency department immediately. Please continue to putting back on smoking.  Please try to quit completely, refer to the handout given to you today. Your kidney function was slightly elevated today, please have it rechecked by your primary care provider at your follow-up visit.  Please drink plenty of water and get plenty of rest.   Please read the additional information packets attached to your discharge summary.  Go to the nearest Emergency Department immediately if: You have fever or chills You have shortness of breath that gets worse. You have very bad or constant: Headache. Ear pain. Pain in your forehead, behind your eyes, and over your cheekbones (sinus pain). Chest pain. You have long-lasting (chronic) lung disease along with any of these: Making high-pitched whistling sounds when you breathe, most often when you breathe out (wheezing). Long-lasting cough (more than 14 days). Coughing up blood. A change in your usual mucus. You have a stiff  neck. You have changes in your: Vision. Hearing. Thinking. Mood.    You have any new/concerning or worsening of symptoms.  Do not take your medicine if  develop an itchy rash, swelling in your mouth or lips, or difficulty breathing; call 911 and seek immediate emergency medical attention if this occurs.  You may review your lab tests and imaging results in their entirety on your MyChart account.  Please discuss all results of fully with your primary care provider and other specialist at your follow-up visit.  Note: Portions of this text may have been transcribed using voice recognition software. Every effort was made to ensure accuracy; however, inadvertent computerized transcription errors may still be present.

## 2022-10-24 NOTE — ED Triage Notes (Addendum)
Pt c/o generalized body aches , productive cough, fatigue x 1 week.   Theraflu, nyquil without relief.

## 2022-10-26 ENCOUNTER — Other Ambulatory Visit (HOSPITAL_COMMUNITY): Payer: Self-pay

## 2022-10-26 NOTE — Congregational Nurse Program (Signed)
  Dept: [phone]   Congregational Nurse Program Note  Date of Encounter: 10/26/2022  Spoke with resident via telephone while he was at work, ER visit on Sunday for respiratory infection.  Made appointment with PCP for follow-up visit and arranged for prescription to be filled at [facility] Past Medical History: Past Medical History:  Diagnosis Date   Cocaine abuse (HCC)    Suicide attempt [facility])    advises has attempted to hang self in past    Encounter Details:  CNP Questionnaire - 10/26/22 1221       Questionnaire   Ask client: Do you give verbal consent for me to treat you today? 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    Insurance/Financial Assistance Referral N/A    Medication Have Medication Insecurities;Provided Medication Assistance    Medical Provider Yes    Screening Referrals Made N/A    Medical Referrals Made [facility] P[facility]    Medical Appointment Made N/A    Recently w/o PCP, now 1st time PCP visit completed due to CNs referral or appointment made N/A    Food Have Food Insecurities    Transportation Need transportation assistance    Housing/Utilities No permanent housing    Interpersonal Safety Do not feel safe at current residence    Interventions Advocate/Support;Counsel;Navigate Healthcare System;Case Management    Abnormal to Normal Screening Since Last CN Visit N/A    Screenings CN Performed N/A    Sent Client to Lab for: N/A    Did client attend any of the following based off CNs referral or appointments made? N/A    ED Visit Averted N/A    Life-Saving Intervention Made N/A

## 2022-10-27 ENCOUNTER — Other Ambulatory Visit (HOSPITAL_COMMUNITY): Payer: Self-pay

## 2022-11-23 ENCOUNTER — Inpatient Hospital Stay: Payer: 59 | Admitting: Critical Care Medicine

## 2022-11-30 ENCOUNTER — Encounter: Payer: Self-pay | Admitting: Critical Care Medicine

## 2022-11-30 ENCOUNTER — Ambulatory Visit: Payer: 59 | Attending: Critical Care Medicine | Admitting: Critical Care Medicine

## 2022-11-30 VITALS — BP 128/78 | HR 49 | Wt 190.4 lb

## 2022-11-30 DIAGNOSIS — Z1211 Encounter for screening for malignant neoplasm of colon: Secondary | ICD-10-CM | POA: Diagnosis not present

## 2022-11-30 DIAGNOSIS — F1911 Other psychoactive substance abuse, in remission: Secondary | ICD-10-CM

## 2022-11-30 DIAGNOSIS — K029 Dental caries, unspecified: Secondary | ICD-10-CM | POA: Diagnosis not present

## 2022-11-30 DIAGNOSIS — R030 Elevated blood-pressure reading, without diagnosis of hypertension: Secondary | ICD-10-CM

## 2022-11-30 DIAGNOSIS — J4489 Other specified chronic obstructive pulmonary disease: Secondary | ICD-10-CM | POA: Diagnosis not present

## 2022-11-30 DIAGNOSIS — Z5901 Sheltered homelessness: Secondary | ICD-10-CM | POA: Diagnosis not present

## 2022-11-30 DIAGNOSIS — F17210 Nicotine dependence, cigarettes, uncomplicated: Secondary | ICD-10-CM

## 2022-11-30 NOTE — Assessment & Plan Note (Signed)
Will obtain housing soon

## 2022-11-30 NOTE — Assessment & Plan Note (Signed)
Blood pressure normal monitor

## 2022-11-30 NOTE — Assessment & Plan Note (Signed)
COPD stable at this time continue with albuterol as needed

## 2022-11-30 NOTE — Assessment & Plan Note (Signed)
Needs dentures and extraction of teeth does not have insurance at this time he will try to buy an insurance plan for dental

## 2022-11-30 NOTE — Progress Notes (Signed)
New Patient Office Visit  Subjective    Patient ID: Ruben Mitchell, male    DOB: [birth year]  Age: 57 y.o. 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  He was seen end of January with a cough and foot pain with severe onychomycosis of the feet and calluses.  He smokes a pack a day of cigarettes.  He does not have a primary care provider.  He works second shift 4:30 PM to 1 AM and has to walk an hour to and from work from the [employer] city location of the shelter to the [employer]. location as a Museum/gallery exhibitions officer.  He is trying to find work closer to the shelter.  Patient needs a flu vaccine and agrees to receive this.  Patient notes decreased vision in the left eye and also has severe dental caries.  There is a history of suicidality in the past he is not currently suicidal and also history of cocaine use he is no longer using cocaine. Patient has upcoming podiatry referral.  11/30/22 Patient seen and return follow-up he was in the emergency room a month ago for a viral illness he is improved now.  He is no longer in the homeless shelter living with a friend about to get an apartment.  Blood pressure on arrival 128/78.  The patient has no complaints.  The patient has been to podiatry and they helped his onychomycosis patient still smoking but decreased Outpatient Encounter Medications as of 11/30/2022  Medication Sig   albuterol (VENTOLIN HFA) 108 (90 Base) MCG/ACT inhaler Inhale 2 puffs into the lungs every 6 (six) hours as needed for wheezing or shortness of breath.   [DISCONTINUED] doxycycline (VIBRAMYCIN) 100 MG capsule Take 1 capsule (100 mg total) by mouth 2 (two) times daily for 7 days (Patient not taking: Reported on 11/30/2022)   No facility-administered encounter medications on file as of  11/30/2022.    Past Medical History:  Diagnosis Date   Cocaine abuse (HCC)    Suicide attempt [facility])    advises has attempted to hang self in past    Past Surgical History:  Procedure Laterality Date   fractured right forearm Left     History reviewed. No pertinent family history.  Social History   Socioeconomic History   Marital status: Single    Spouse name: Not on file   Number of children: Not on file   Years of education: Not on file   Highest education level: Not on file  Occupational History   Not on file  Tobacco Use   Smoking status: Every Day    Packs/day: .1    Types: Cigarettes   Smokeless tobacco: Not on file  Substance and Sexual Activity   Alcohol use: Yes    Comment: soci   Drug use: Not Currently    Frequency: 2.0 times per week    Types: Cocaine, Marijuana    Comment: last use months ago   Sexual activity: Not on file  Other Topics Concern   Not on file  Social History Narrative   Not on file   Social Determinants of Health   Financial Resource Strain: Not on file  Food Insecurity: Not on file  Transportation Needs: Not on file  Physical Activity: Not on file  Stress: Not  on file  Social Connections: Not on file  Intimate Partner Violence: Not on file    Review of Systems  Constitutional:  Negative for chills, diaphoresis, fever, malaise/fatigue and weight loss.  HENT:  Negative for congestion, ear discharge, ear pain, hearing loss, nosebleeds, sore throat and tinnitus.   Eyes:  Negative for blurred vision, double vision, photophobia, pain, discharge and redness.  Respiratory:  Negative for cough, hemoptysis, sputum production, shortness of breath, wheezing and stridor.         excess mucus yellow  Cardiovascular:  Negative for chest pain, palpitations, orthopnea, claudication, leg swelling and PND.  Gastrointestinal:  Negative for abdominal pain, blood in stool, constipation, diarrhea, heartburn, melena, nausea and vomiting.   Genitourinary:  Negative for dysuria, flank pain, frequency, hematuria and urgency.  Musculoskeletal:  Negative for back pain, falls, joint pain, myalgias and neck pain.  Skin:  Negative for itching and rash.  Neurological:  Negative for dizziness, tingling, tremors, sensory change, speech change, focal weakness, seizures, loss of consciousness, weakness and headaches.  Endo/Heme/Allergies:  Negative for environmental allergies and polydipsia. Does not bruise/bleed easily.  Psychiatric/Behavioral:  Negative for depression, hallucinations, memory loss, substance abuse and suicidal ideas. The patient is not nervous/anxious and does not have insomnia.   All other systems reviewed and are negative.       Objective    BP 128/78   Pulse (!) 49   Wt 190 lb 6.4 oz (86.4 kg)   SpO2 96%   BMI 28.12 kg/m   Physical Exam Vitals reviewed.  Constitutional:      Appearance: Normal appearance. He is well-developed. He is not diaphoretic.  HENT:     Head: Normocephalic and atraumatic.     Nose: No nasal deformity, septal deviation, mucosal edema or rhinorrhea.     Right Sinus: No maxillary sinus tenderness or frontal sinus tenderness.     Left Sinus: No maxillary sinus tenderness or frontal sinus tenderness.     Mouth/Throat:     Pharynx: No oropharyngeal exudate.     Comments: Poor dentition Eyes:     General: No scleral icterus.    Conjunctiva/sclera: Conjunctivae normal.     Pupils: Pupils are equal, round, and reactive to light.  Neck:     Thyroid: No thyromegaly.     Vascular: No carotid bruit or JVD.     Trachea: Trachea normal. No tracheal tenderness or tracheal deviation.  Cardiovascular:     Rate and Rhythm: Normal rate and regular rhythm.     Chest Wall: PMI is not displaced.     Pulses: Normal pulses. No decreased pulses.     Heart sounds: Normal heart sounds, S1 normal and S2 normal. Heart sounds not distant. No murmur heard.    No systolic murmur is present.     No  diastolic murmur is present.     No friction rub. No gallop. No S3 or S4 sounds.  Pulmonary:     Effort: Pulmonary effort is normal. No tachypnea, accessory muscle usage or respiratory distress.     Breath sounds: Normal breath sounds. No stridor. No decreased breath sounds, wheezing, rhonchi or rales.  Chest:     Chest wall: No tenderness.  Abdominal:     General: Bowel sounds are normal. There is no distension.     Palpations: Abdomen is soft. Abdomen is not rigid.     Tenderness: There is no abdominal tenderness. There is no guarding or rebound.  Musculoskeletal:  General: Normal range of motion.     Cervical back: Normal range of motion and neck supple. No edema, erythema or rigidity. No muscular tenderness. Normal range of motion.  Lymphadenopathy:     Head:     Right side of head: No submental or submandibular adenopathy.     Left side of head: No submental or submandibular adenopathy.     Cervical: No cervical adenopathy.  Skin:    General: Skin is warm and dry.     Coloration: Skin is not pale.     Findings: No rash.     Nails: There is no clubbing.  Neurological:     Mental Status: He is alert and oriented to person, place, and time.     Sensory: No sensory deficit.  Psychiatric:        Speech: Speech normal.        Behavior: Behavior normal.         Assessment & Plan:   Problem List Items Addressed This Visit       Respiratory   COPD with chronic bronchitis - Primary    COPD stable at this time continue with albuterol as needed        Digestive   Dental caries    Needs dentures and extraction of teeth does not have insurance at this time he will try to buy an insurance plan for dental        Other   Cigarette smoker (Chronic)       Current smoking consumption amount: 1 pack a day  Dicsussion on advise to quit smoking and smoking impacts: Lung and cardiovascular impact  Patient's willingness to quit: Not able to quit now in the  shelter  Methods to quit smoking discussed: Behavioral modification  Medication management of smoking session drugs discussed: Nicotine replacement  Resources provided:  AVS   Setting quit date not established  Follow-up arranged 2 months   Time spent counseling the patient:   5 minutes      Sheltered homelessness    Will obtain housing soon      History of substance abuse (HCC)    No longer using any drugs monitor      RESOLVED: Elevated blood pressure reading in office without diagnosis of hypertension    Blood pressure normal monitor      Other Visit Diagnoses     Colon cancer screening       Relevant Orders   Fecal occult blood, imunochemical      Screening labs obtained colon cancer screening test obtained  Return in about 5 months (around 05/02/2023) for primary care follow up.   Shan Levans, MD

## 2022-11-30 NOTE — Assessment & Plan Note (Signed)
    Current smoking consumption amount: 1 pack a day  Dicsussion on advise to quit smoking and smoking impacts: Lung and cardiovascular impact  Patient's willingness to quit: Not able to quit now in the shelter  Methods to quit smoking discussed: Behavioral modification  Medication management of smoking session drugs discussed: Nicotine replacement  Resources provided:  AVS   Setting quit date not established  Follow-up arranged 2 months   Time spent counseling the patient:   5 minutes 

## 2022-11-30 NOTE — Assessment & Plan Note (Signed)
No longer using any drugs monitor

## 2022-11-30 NOTE — Patient Instructions (Signed)
Colon cancer kit given please process and return  No medication changes  Return 5 months  Let us know if you purchase a dental plan

## 2023-07-02 ENCOUNTER — Encounter (HOSPITAL_COMMUNITY): Payer: Self-pay

## 2023-07-02 ENCOUNTER — Emergency Department (HOSPITAL_COMMUNITY)
Admission: EM | Admit: 2023-07-02 | Discharge: 2023-07-02 | Disposition: A | Discharge status: Home / Self Care | Payer: 59 | Attending: Emergency Medicine | Admitting: Emergency Medicine

## 2023-07-02 ENCOUNTER — Emergency Department (HOSPITAL_COMMUNITY): Payer: 59

## 2023-07-02 ENCOUNTER — Other Ambulatory Visit: Payer: Self-pay

## 2023-07-02 DIAGNOSIS — J9801 Acute bronchospasm: Secondary | ICD-10-CM | POA: Diagnosis not present

## 2023-07-02 DIAGNOSIS — R059 Cough, unspecified: Secondary | ICD-10-CM | POA: Diagnosis present

## 2023-07-02 DIAGNOSIS — Z20822 Contact with and (suspected) exposure to covid-19: Secondary | ICD-10-CM | POA: Insufficient documentation

## 2023-07-02 DIAGNOSIS — J449 Chronic obstructive pulmonary disease, unspecified: Secondary | ICD-10-CM | POA: Insufficient documentation

## 2023-07-02 DIAGNOSIS — Z87891 Personal history of nicotine dependence: Secondary | ICD-10-CM | POA: Diagnosis not present

## 2023-07-02 LAB — BASIC METABOLIC PANEL
Anion gap: 9 (ref 5–15)
BUN: 25 mg/dL — ABNORMAL HIGH (ref 6–20)
CO2: 21 mmol/L — ABNORMAL LOW (ref 22–32)
Calcium: 8.6 mg/dL — ABNORMAL LOW (ref 8.9–10.3)
Chloride: 106 mmol/L (ref 98–111)
Creatinine, Ser: 0.94 mg/dL (ref 0.61–1.24)
GFR, Estimated: >60 mL/min (ref >60)
Glucose, Bld: 133 mg/dL — ABNORMAL HIGH (ref 70–99)
Potassium: 3.6 mmol/L (ref 3.5–5.1)
Sodium: 136 mmol/L (ref 135–145)

## 2023-07-02 LAB — CBC WITH DIFFERENTIAL/PLATELET
Abs Immature Granulocytes: 0.03 10*3/uL (ref 0.00–0.07)
Basophils Absolute: 0 10*3/uL (ref 0.0–0.1)
Basophils Relative: 1 %
Eosinophils Absolute: 0.1 10*3/uL (ref 0.0–0.5)
Eosinophils Relative: 2 %
HCT: 45.2 % (ref 39.0–52.0)
Hemoglobin: 14.7 g/dL (ref 13.0–17.0)
Immature Granulocytes: 1 %
Lymphocytes Relative: 34 %
Lymphs Abs: 2.1 10*3/uL (ref 0.7–4.0)
MCH: 27.4 pg (ref 26.0–34.0)
MCHC: 32.5 g/dL (ref 30.0–36.0)
MCV: 84.3 fL (ref 80.0–100.0)
Monocytes Absolute: 0.8 10*3/uL (ref 0.1–1.0)
Monocytes Relative: 13 %
Neutro Abs: 3.1 10*3/uL (ref 1.7–7.7)
Neutrophils Relative %: 49 %
Platelets: 225 10*3/uL (ref 150–400)
RBC: 5.36 MIL/uL (ref 4.22–5.81)
RDW: 14.7 % (ref 11.5–15.5)
WBC: 6.2 10*3/uL (ref 4.0–10.5)
nRBC: 0 % (ref 0.0–0.2)

## 2023-07-02 LAB — RESP PANEL BY RT-PCR (RSV, FLU A&B, COVID)  RVPGX2
Influenza A by PCR: NEGATIVE
Influenza B by PCR: NEGATIVE
Resp Syncytial Virus by PCR: NEGATIVE
SARS Coronavirus 2 by RT PCR: NEGATIVE

## 2023-07-02 MED ORDER — PREDNISONE 10 MG PO TABS: 40.0000 mg | ORAL_TABLET | Freq: Every day | ORAL | 0 refills | Status: DC

## 2023-07-02 MED ORDER — ALBUTEROL SULFATE HFA 108 (90 BASE) MCG/ACT IN AERS: 1.0000 | INHALATION_SPRAY | Freq: Four times a day (QID) | RESPIRATORY_TRACT | 0 refills | Status: AC | PRN

## 2023-07-02 MED ORDER — ALBUTEROL SULFATE HFA 108 (90 BASE) MCG/ACT IN AERS
1.0000 | INHALATION_SPRAY | Freq: Once | RESPIRATORY_TRACT | Status: AC
  Administered 2023-07-02: 1 via RESPIRATORY_TRACT
  Filled 2023-07-02: qty 6.7

## 2023-07-02 NOTE — ED Triage Notes (Signed)
Cough, sob for 3-4 days. Worse last night. EMS gave 125mg  of solumedrol, 2 nebs, and 2 duonebs.

## 2023-07-02 NOTE — ED Notes (Signed)
Pt reports "feeling" better. Pt breathing easy with equal chest rise and pt able to speak in full sentences without taking a breath

## 2023-07-02 NOTE — ED Provider Notes (Signed)
[place] EMERGENCY DEPARTMENT AT [facility] Provider Note   CSN: [id number] Arrival date & time: 07/02/23  2009     History  No chief complaint on file.   Ruben Mitchell is a 58 y.o. male.  58 year old male with prior medical history as detailed below presents for evaluation.  Patient with history of cigarette use and COPD.  Patient reports that over the last day or so he developed cough and congestion.  Felt his symptoms were worse this evening as he was attempting to check into the shelter.  He is currently homeless.  EMS administered Solu-Medrol 125 mg, 2 breathing treatments during transport.  On arrival the patient feels improved.  He denies recent fever.  He complains of chronic cough.  The history is provided by the patient and medical records.       Home Medications Prior to Admission medications   Medication Sig Start Date End Date Taking? Authorizing Provider  albuterol (VENTOLIN HFA) 108 (90 Base) MCG/ACT inhaler Inhale 2 puffs into the lungs every 6 (six) hours as needed for wheezing or shortness of breath. 07/14/22   Storm Frisk, MD      Allergies    Aspirin and Penicillins    Review of Systems   Review of Systems  All other systems reviewed and are negative.   Physical Exam Updated Vital Signs There were no vitals taken for this visit. Physical Exam Vitals and nursing note reviewed.  Constitutional:      General: He is not in acute distress.    Appearance: Normal appearance. He is well-developed.  HENT:     Head: Normocephalic and atraumatic.  Eyes:     Conjunctiva/sclera: Conjunctivae normal.     Pupils: Pupils are equal, round, and reactive to light.  Cardiovascular:     Rate and Rhythm: Normal rate and regular rhythm.     Heart sounds: Normal heart sounds.  Pulmonary:     Effort: Pulmonary effort is normal. No respiratory distress.     Comments: Trace expiratory wheezes in all lung fields Abdominal:     General: There is no  distension.     Palpations: Abdomen is soft.     Tenderness: There is no abdominal tenderness.  Musculoskeletal:        General: No deformity. Normal range of motion.     Cervical back: Normal range of motion and neck supple.  Skin:    General: Skin is warm and dry.  Neurological:     General: No focal deficit present.     Mental Status: He is alert and oriented to person, place, and time.     ED Results / Procedures / Treatments   Labs (all labs ordered are listed, but only abnormal results are displayed) Labs Reviewed - No data to display  EKG None  Radiology No results found.  Procedures Procedures    Medications Ordered in ED Medications - No data to display  ED Course/ Medical Decision Making/ A&P                                 Medical Decision Making Amount and/or Complexity of Data Reviewed Labs: ordered. Radiology: ordered.  Risk Prescription drug management.    Medical Screen Complete  This patient presented to the ED with complaint of cough, congestion, wheezing.  This complaint involves an extensive number of treatment options. The initial differential diagnosis includes, but is not limited  to, viral versus bacterial infection, bronchospasm, etc.  This presentation is: Acute, Chronic, Self-Limited, Previously Undiagnosed, Uncertain Prognosis, Complicated, Systemic Symptoms, and Threat to Life/Bodily Function  Patient with known history of COPD presents with complaint of increasing cough and congestion.  EMS administered Solu-Medrol, breathing treatments during transport.  On arrival patient feels much improved.  ED workup was without evidence of acute abnormality.  Patient feels much improved and desires discharge.  Patient would benefit from short course of prednisone and continued use of albuterol as needed.  Presentation is entirely consistent with likely mild COPD exacerbation.  Importance of close follow-up stressed.  Strict return  precautions given and understood.  Additional history obtained:  Additional history obtained from EMS External records from outside sources obtained and reviewed including prior ED visits and prior Inpatient records.    Lab Tests:  I ordered and personally interpreted labs.  The pertinent results include: CBC, BMP, COVID, flu, RSV   Imaging Studies ordered:  I ordered imaging studies including chest x-ray I independently visualized and interpreted obtained imaging which showed NAD I agree with the radiologist interpretation.   Cardiac Monitoring:  The patient was maintained on a cardiac monitor.  I personally viewed and interpreted the cardiac monitor which showed an underlying rhythm of: NSR   Medicines ordered:  I ordered medication including albuterol for wheezing Reevaluation of the patient after these medicines showed that the patient: improved    Problem List / ED Course:  Bronchospasm   Reevaluation:  After the interventions noted above, I reevaluated the patient and found that they have: improved   Disposition:  After consideration of the diagnostic results and the patients response to treatment, I feel that the patent would benefit from close outpatient follow-up.          Final Clinical Impression(s) / ED Diagnoses Final diagnoses:  Bronchospasm    Rx / DC Orders ED Discharge Orders     None         Wynetta Fines, MD 07/02/23 2220

## 2023-07-02 NOTE — Discharge Instructions (Signed)
 Return for any problem.  ?

## 2023-07-03 ENCOUNTER — Emergency Department (HOSPITAL_COMMUNITY)
Admission: EM | Admit: 2023-07-03 | Discharge: 2023-07-03 | Discharge status: Against Medical Advice | Payer: 59 | Attending: Emergency Medicine | Admitting: Emergency Medicine

## 2023-07-03 ENCOUNTER — Other Ambulatory Visit: Payer: Self-pay

## 2023-07-03 ENCOUNTER — Encounter (HOSPITAL_COMMUNITY): Payer: Self-pay | Admitting: *Deleted

## 2023-07-03 DIAGNOSIS — Z5321 Procedure and treatment not carried out due to patient leaving prior to being seen by health care provider: Secondary | ICD-10-CM | POA: Insufficient documentation

## 2023-07-03 DIAGNOSIS — R1084 Generalized abdominal pain: Secondary | ICD-10-CM | POA: Diagnosis present

## 2023-07-03 MED ORDER — DICYCLOMINE HCL 10 MG PO CAPS
10.0000 mg | ORAL_CAPSULE | Freq: Once | ORAL | Status: AC
  Administered 2023-07-03: 10 mg via ORAL
  Filled 2023-07-03: qty 1

## 2023-07-03 MED ORDER — ONDANSETRON 4 MG PO TBDP
4.0000 mg | ORAL_TABLET | Freq: Once | ORAL | Status: AC
  Administered 2023-07-03: 4 mg via ORAL
  Filled 2023-07-03: qty 1

## 2023-07-03 NOTE — ED Triage Notes (Signed)
Pt c/o generalized abd pain that started after eating a sandwich in the ER waiting room, Denies NV, described as a rubber band that squeezes then releases

## 2023-07-03 NOTE — ED Notes (Signed)
 Pt left AMA

## 2023-08-09 NOTE — Congregational Nurse Program (Signed)
 Mr. Aikey to RN office. He is requesting encouragement last year was difficult as he lost his job and fiance and has been working to get back on his feet. He is planning on seeing Lavinia Sharps in her clinic and continuing to look for jobs. RN listened empathetically and provided therapeutic communication for client. As well as offering spiritual guidance and encouragement.

## 2023-12-16 ENCOUNTER — Emergency Department (HOSPITAL_COMMUNITY)
Admission: EM | Admit: 2023-12-16 | Discharge: 2023-12-16 | Disposition: A | Discharge status: Home / Self Care | Payer: Self-pay | Attending: Emergency Medicine | Admitting: Emergency Medicine

## 2023-12-16 ENCOUNTER — Other Ambulatory Visit: Payer: Self-pay

## 2023-12-16 DIAGNOSIS — I10 Essential (primary) hypertension: Secondary | ICD-10-CM | POA: Insufficient documentation

## 2023-12-16 DIAGNOSIS — K0889 Other specified disorders of teeth and supporting structures: Secondary | ICD-10-CM | POA: Insufficient documentation

## 2023-12-16 DIAGNOSIS — Z79899 Other long term (current) drug therapy: Secondary | ICD-10-CM | POA: Insufficient documentation

## 2023-12-16 MED ORDER — AMLODIPINE BESYLATE 5 MG PO TABS: 5.0000 mg | ORAL_TABLET | Freq: Every day | ORAL | 2 refills | Status: AC

## 2023-12-16 MED ORDER — OXYCODONE HCL 5 MG PO TABS
5.0000 mg | ORAL_TABLET | ORAL | Status: AC
  Administered 2023-12-16: 5 mg via ORAL
  Filled 2023-12-16: qty 1

## 2023-12-16 MED ORDER — OXYCODONE-ACETAMINOPHEN 5-325 MG PO TABS
1.0000 | ORAL_TABLET | Freq: Once | ORAL | Status: AC
  Administered 2023-12-16: 1 via ORAL
  Filled 2023-12-16: qty 1

## 2023-12-16 MED ORDER — AMLODIPINE BESYLATE 5 MG PO TABS
5.0000 mg | ORAL_TABLET | Freq: Once | ORAL | Status: AC
  Administered 2023-12-16: 5 mg via ORAL
  Filled 2023-12-16: qty 1

## 2023-12-16 MED ORDER — OXYCODONE HCL 5 MG PO TABS: 5.0000 mg | ORAL_TABLET | ORAL | 0 refills | Status: AC | PRN

## 2023-12-16 MED ORDER — CLINDAMYCIN HCL 150 MG PO CAPS: 150.0000 mg | ORAL_CAPSULE | Freq: Four times a day (QID) | ORAL | 0 refills | Status: DC

## 2023-12-16 MED ORDER — CHLORHEXIDINE GLUCONATE 0.12 % MT SOLN: 15.0000 mL | Freq: Two times a day (BID) | OROMUCOSAL | 0 refills | Status: AC

## 2023-12-16 NOTE — ED Provider Notes (Signed)
 [place] EMERGENCY DEPARTMENT AT [facility] Provider Note   CSN: [id number] Arrival date & time: 12/16/23  9671     Patient presents with: Dental Pain (Hypertensive)   Ruben Mitchell is a 58 y.o. male.   58 year old male who presents to the emergency department with dental pain and elevated blood pressure.  Patient reports that he has a tooth in the right side of his mouth on his mandible that has been hurting since last night.  It is 12/10 in severity.  Says that occasionally his blood pressure will be high but is been higher than usual since the pain started.  Not have any chest pain or shortness of breath.  Says that the tooth pain is worsened when he moves his mouth and tries to talk but there is no exertional jaw pain at all.  Denies any other symptoms at this time.       Prior to Admission medications   Medication Sig Start Date End Date Taking? Authorizing Provider  amLODipine (NORVASC) 5 MG tablet Take 1 tablet (5 mg total) by mouth daily. 12/16/23  Yes Yolande Lamar BROCKS, MD  chlorhexidine (PERIDEX) 0.12 % solution Use as directed 15 mLs in the mouth or throat 2 (two) times daily. 12/16/23  Yes Yolande Lamar BROCKS, MD  clindamycin (CLEOCIN) 150 MG capsule Take 1 capsule (150 mg total) by mouth every 6 (six) hours. 12/16/23  Yes Yolande Lamar BROCKS, MD  oxyCODONE (ROXICODONE) 5 MG immediate release tablet Take 1 tablet (5 mg total) by mouth every 4 (four) hours as needed. 12/16/23  Yes Yolande Lamar BROCKS, MD  albuterol  (VENTOLIN  HFA) 108 [phone] Base) MCG/ACT inhaler Inhale 2 puffs into the lungs every 6 (six) hours as needed for wheezing or shortness of breath. 07/14/22   Brien Belvie BRAVO, MD  albuterol  (VENTOLIN  HFA) 108 (90 Base) MCG/ACT inhaler Inhale 1-2 puffs into the lungs every 6 (six) hours as needed for wheezing or shortness of breath. 07/02/23   Laurice Maude BROCKS, MD  predniSONE  (DELTASONE ) 10 MG tablet Take 4 tablets (40 mg total) by mouth daily. 07/02/23   Laurice Maude BROCKS, MD    Allergies: Aspirin and Penicillins    Review of Systems  Updated Vital Signs BP (!) 227/117 (BP Location: Right Arm)   Pulse 66   Temp 98.1 F (36.7 C)   Resp 18   SpO2 99%   Physical Exam Vitals and nursing note reviewed.  Constitutional:      General: He is not in acute distress.    Appearance: He is well-developed.  HENT:     Head: Normocephalic and atraumatic.     Right Ear: External ear normal.     Left Ear: External ear normal.     Nose: Nose normal.     Mouth/Throat:     Comments: Edentulous in the maxilla.  Poor dentition of the mandibular teeth.  Tenderness to palpation of the right lower canine.  No fluctuance palpated.  No trismus.  No significant facial swelling.  Uvula midline. Eyes:     Extraocular Movements: Extraocular movements intact.     Conjunctiva/sclera: Conjunctivae normal.     Pupils: Pupils are equal, round, and reactive to light.  Cardiovascular:     Rate and Rhythm: Normal rate and regular rhythm.     Heart sounds: Normal heart sounds.  Pulmonary:     Effort: Pulmonary effort is normal. No respiratory distress.     Breath sounds: Normal breath sounds. No stridor.  Musculoskeletal:  Cervical back: Normal range of motion and neck supple.     Right lower leg: No edema.     Left lower leg: No edema.  Skin:    General: Skin is warm and dry.  Neurological:     Mental Status: He is alert. Mental status is at baseline.  Psychiatric:        Mood and Affect: Mood normal.        Behavior: Behavior normal.     (all labs ordered are listed, but only abnormal results are displayed) Labs Reviewed - No data to display  EKG: None  Radiology: No results found.   Procedures   Medications Ordered in the ED  amLODipine (NORVASC) tablet 5 mg (has no administration in time range)  oxyCODONE (Oxy IR/ROXICODONE) immediate release tablet 5 mg (has no administration in time range)  oxyCODONE-acetaminophen  (PERCOCET/ROXICET) 5-325 MG  per tablet 1 tablet (1 tablet Oral Given 12/16/23 0341)                                    Medical Decision Making Risk Prescription drug management.   [name] is a 58 y.o. male who presents to the emergency department with elevated blood pressure dental pain  Initial Ddx:  Hypertensive emergency, ICH, CHF, MI, asymptomatic hypertension, dental caries, abscess  MDM:  Patient presents emergency department with elevated blood pressure and dental pain.  Appears that his pain is localized to his right lower canine.  No abscess that needs to be drained at this point in time.  Suspect that he likely has a dental carry causing his pain.  This likely is causing his blood pressure to be elevated as well.  At this point in time does not have any symptoms of be concerning for hypertensive emergency such as severe headache, chest pain, or shortness of breath.    Plan:  Oxycodone Amlodipine Dentistry and PCP follow-up  This patient presents to the ED for concern of complaints listed in HPI, this involves an extensive number of treatment options, and is a complaint that carries with it a high risk of complications and morbidity. Disposition including potential need for admission considered.   Dispo: DC Home. Return precautions discussed including, but not limited to, those listed in the AVS. Allowed pt time to ask questions which were answered fully prior to dc.  Records reviewed Outpatient Clinic Notes I have reviewed the patients home medications and made adjustments as needed   Final diagnoses:  Toothache  Uncontrolled hypertension    ED Discharge Orders          Ordered    amLODipine (NORVASC) 5 MG tablet  Daily        12/16/23 0800    oxyCODONE (ROXICODONE) 5 MG immediate release tablet  Every 4 hours PRN        12/16/23 0800    chlorhexidine (PERIDEX) 0.12 % solution  2 times daily        12/16/23 0800    clindamycin (CLEOCIN) 150 MG capsule  Every 6 hours         12/16/23 0800               Yolande Lamar BROCKS, MD 12/16/23 [phone]

## 2023-12-16 NOTE — ED Triage Notes (Signed)
 Patient woke up this morning with right upper molar pain . Hypertensive at arrival .

## 2023-12-16 NOTE — Discharge Instructions (Addendum)
 You were seen for your high blood pressure (hypertension) and dental pain in the emergency department.   At home, please take the amlodipine that you were prescribed for your blood pressure.  Use Tylenol  and ibuprofen for your pain.  Take oxycodone for any breakthrough pain that you have but do not take this before driving or operating heavy machinery.  Do not take with alcohol.  Use the chlorhexidine rinse and clindamycin in case of a dental infection.  Check your MyChart online for the results of any tests that had not resulted by the time you left the emergency department.   Follow-up with your primary doctor in 2-3 days regarding your visit.  Talk to them about your elevated blood pressure.  Follow-up with a dentist as soon as possible about your tooth pain.  Return immediately to the emergency department if you experience any of the following: Severe headache, vision changes, numbness or weakness of your arms or legs, difficulty breathing, chest pain, or any other concerning symptoms.    Thank you for visiting our Emergency Department. It was a pleasure taking care of you today.

## 2023-12-16 NOTE — ED Notes (Signed)
 Triage RN advised that pt was asking for oral antiseptic for his dental pain and triage RN advised EMT that he can wait for MD to manage his dental issues, as he was given percocet for pain.

## 2024-02-23 ENCOUNTER — Other Ambulatory Visit: Payer: Self-pay

## 2024-02-23 ENCOUNTER — Encounter (HOSPITAL_COMMUNITY): Payer: Self-pay | Admitting: *Deleted

## 2024-02-23 ENCOUNTER — Emergency Department (HOSPITAL_COMMUNITY)
Admission: EM | Admit: 2024-02-23 | Discharge: 2024-02-23 | Disposition: A | Discharge status: Home / Self Care | Payer: Self-pay | Attending: Emergency Medicine | Admitting: Emergency Medicine

## 2024-02-23 DIAGNOSIS — J029 Acute pharyngitis, unspecified: Secondary | ICD-10-CM | POA: Insufficient documentation

## 2024-02-23 LAB — RESP PANEL BY RT-PCR (RSV, FLU A&B, COVID)  RVPGX2
Influenza A by PCR: NEGATIVE
Influenza B by PCR: NEGATIVE
Resp Syncytial Virus by PCR: NEGATIVE
SARS Coronavirus 2 by RT PCR: NEGATIVE

## 2024-02-23 LAB — GROUP A STREP BY PCR: Group A Strep by PCR: NOT DETECTED

## 2024-02-23 MED ORDER — DEXAMETHASONE 1 MG/ML PO CONC
10.0000 mg | Freq: Once | ORAL | Status: AC
  Administered 2024-02-23: 10 mg via ORAL
  Filled 2024-02-23: qty 10

## 2024-02-23 NOTE — ED Triage Notes (Signed)
 Pt c/o sore throat x 5 days, denies all other symptoms, no fever

## 2024-02-23 NOTE — ED Provider Notes (Signed)
 [place] EMERGENCY DEPARTMENT AT [facility] Provider Note   CSN: [id number] Arrival date & time: 02/23/24  9941     Patient presents with: Sore Throat   Ruben Mitchell is a 58 y.o. male.  Presents today for sore throat x 5 days.  Patient denies fever, chills, nausea, vomiting, cough, congestion, shortness of breath, chest pain, any other complaints at this time.    Sore Throat       Prior to Admission medications   Medication Sig Start Date End Date Taking? Authorizing Provider  albuterol  (VENTOLIN  HFA) 108 (90 Base) MCG/ACT inhaler Inhale 2 puffs into the lungs every 6 (six) hours as needed for wheezing or shortness of breath. 07/14/22   Brien Belvie BRAVO, MD  albuterol  (VENTOLIN  HFA) 108 [phone] Base) MCG/ACT inhaler Inhale 1-2 puffs into the lungs every 6 (six) hours as needed for wheezing or shortness of breath. 07/02/23   Laurice Maude BROCKS, MD  amLODipine  (NORVASC ) 5 MG tablet Take 1 tablet (5 mg total) by mouth daily. 12/16/23   Yolande Lamar BROCKS, MD  chlorhexidine  (PERIDEX ) 0.12 % solution Use as directed 15 mLs in the mouth or throat 2 (two) times daily. 12/16/23   Yolande Lamar BROCKS, MD  clindamycin  (CLEOCIN ) 150 MG capsule Take 1 capsule (150 mg total) by mouth every 6 (six) hours. 12/16/23   Yolande Lamar BROCKS, MD  oxyCODONE  (ROXICODONE ) 5 MG immediate release tablet Take 1 tablet (5 mg total) by mouth every 4 (four) hours as needed. 12/16/23   Yolande Lamar BROCKS, MD  predniSONE  (DELTASONE ) 10 MG tablet Take 4 tablets (40 mg total) by mouth daily. 07/02/23   Laurice Maude BROCKS, MD    Allergies: Aspirin and Penicillins    Review of Systems  Updated Vital Signs BP (!) 137/96 (BP Location: Right Arm)   Pulse 73   Temp 98.3 F (36.8 C)   Resp 17   Ht 5' 9 (1.753 m)   Wt 93 kg   SpO2 99%   BMI 30.28 kg/m   Physical Exam Vitals and nursing note reviewed.  Constitutional:      General: He is not in acute distress.    Appearance: He is well-developed. He is not  ill-appearing.  HENT:     Head: Normocephalic and atraumatic.     Nose: No congestion or rhinorrhea.     Mouth/Throat:     Mouth: Mucous membranes are moist.     Pharynx: Uvula midline. Posterior oropharyngeal erythema present. No pharyngeal swelling or oropharyngeal exudate.  Eyes:     Conjunctiva/sclera: Conjunctivae normal.  Cardiovascular:     Rate and Rhythm: Normal rate and regular rhythm.     Heart sounds: Normal heart sounds. No murmur heard. Pulmonary:     Effort: Pulmonary effort is normal. No tachypnea or respiratory distress.     Breath sounds: Normal breath sounds.  Abdominal:     Palpations: Abdomen is soft.     Tenderness: There is no abdominal tenderness.  Musculoskeletal:        General: No swelling.     Cervical back: Neck supple.  Skin:    General: Skin is warm and dry.     Capillary Refill: Capillary refill takes less than 2 seconds.  Neurological:     General: No focal deficit present.     Mental Status: He is alert and oriented to person, place, and time.  Psychiatric:        Mood and Affect: Mood normal.     (  all labs ordered are listed, but only abnormal results are displayed) Labs Reviewed  GROUP A STREP BY PCR  RESP PANEL BY RT-PCR (RSV, FLU A&B, COVID)  RVPGX2    EKG: None  Radiology: No results found.   Procedures   Medications Ordered in the ED  dexamethasone  (DECADRON ) 1 MG/ML solution 10 mg (has no administration in time range)                                    Medical Decision Making  This patient presents to the ED for concern of sore throat differential diagnosis includes COVID, flu, RSV, strep pharyngitis, viral pharyngitis  Lab Tests:  I Ordered, and personally interpreted labs.  The pertinent results include: Respiratory panel negative, strep PCR negative   Medicines ordered and prescription drug management:  I ordered medication including Decadron     I have reviewed the patients home medicines and have made  adjustments as needed   Problem List / ED Course:  Considered for admission or further workup however patient's vital signs, physical exam, and labs are reassuring.  Patient symptoms likely due to viral pharyngitis.  Patient advised to take Tylenol  and Motrin as well as perform salt water gargles at home.  Patient given return precautions.  I feel patient safe for discharge at this time.        Final diagnoses:  Sore throat    ED Discharge Orders     None          Francis Ileana LOISE DEVONNA 02/23/24 0242    Lorette Mayo, MD 02/23/24 [phone]

## 2024-02-23 NOTE — Discharge Instructions (Addendum)
 Today you were seen for a sore throat, I suspect this is likely due to a virus.  You may alternate taking Tylenol  and Motrin as needed for pain and fever.  You may do salt water gargles as needed for throat soreness.  Thank you for letting us  treat you today. After reviewing your labs, I feel you are safe to go home. Please follow up with your PCP in the next several days and provide them with your records from this visit. Return to the Emergency Room if pain becomes severe or symptoms worsen.

## 2024-02-24 ENCOUNTER — Emergency Department (HOSPITAL_COMMUNITY)
Admission: EM | Admit: 2024-02-24 | Discharge: 2024-02-24 | Disposition: A | Discharge status: Home / Self Care | Payer: Self-pay | Attending: Emergency Medicine | Admitting: Emergency Medicine

## 2024-02-24 ENCOUNTER — Emergency Department (HOSPITAL_COMMUNITY): Payer: Self-pay

## 2024-02-24 ENCOUNTER — Other Ambulatory Visit: Payer: Self-pay

## 2024-02-24 DIAGNOSIS — J029 Acute pharyngitis, unspecified: Secondary | ICD-10-CM | POA: Insufficient documentation

## 2024-02-24 DIAGNOSIS — N3000 Acute cystitis without hematuria: Secondary | ICD-10-CM | POA: Insufficient documentation

## 2024-02-24 LAB — COMPREHENSIVE METABOLIC PANEL WITH GFR
ALT: 15 U/L (ref 0–44)
AST: 26 U/L (ref 15–41)
Albumin: 3.8 g/dL (ref 3.5–5.0)
Alkaline Phosphatase: 79 U/L (ref 38–126)
Anion gap: 13 (ref 5–15)
BUN: 21 mg/dL — ABNORMAL HIGH (ref 6–20)
CO2: 21 mmol/L — ABNORMAL LOW (ref 22–32)
Calcium: 9.2 mg/dL (ref 8.9–10.3)
Chloride: 106 mmol/L (ref 98–111)
Creatinine, Ser: 1.19 mg/dL (ref 0.61–1.24)
GFR, Estimated: >60 mL/min (ref >60)
Glucose, Bld: 94 mg/dL (ref 70–99)
Potassium: 4.2 mmol/L (ref 3.5–5.1)
Sodium: 140 mmol/L (ref 135–145)
Total Bilirubin: 0.3 mg/dL (ref 0.0–1.2)
Total Protein: 6.9 g/dL (ref 6.5–8.1)

## 2024-02-24 LAB — URINALYSIS, W/ REFLEX TO CULTURE (INFECTION SUSPECTED)
Bacteria, UA: NONE SEEN
Bilirubin Urine: NEGATIVE
Glucose, UA: NEGATIVE mg/dL
Ketones, ur: NEGATIVE mg/dL
Nitrite: NEGATIVE
Protein, ur: 30 mg/dL — AB
RBC / HPF: >50 RBC/hpf (ref 0–5)
Specific Gravity, Urine: 1.018 (ref 1.005–1.030)
WBC, UA: >50 WBC/hpf (ref 0–5)
pH: 5 (ref 5.0–8.0)

## 2024-02-24 LAB — CBC WITH DIFFERENTIAL/PLATELET
Abs Immature Granulocytes: 0.02 K/uL (ref 0.00–0.07)
Basophils Absolute: 0 K/uL (ref 0.0–0.1)
Basophils Relative: 0 %
Eosinophils Absolute: 0 K/uL (ref 0.0–0.5)
Eosinophils Relative: 0 %
HCT: 44.9 % (ref 39.0–52.0)
Hemoglobin: 13.8 g/dL (ref 13.0–17.0)
Immature Granulocytes: 0 %
Lymphocytes Relative: 10 %
Lymphs Abs: 1.3 K/uL (ref 0.7–4.0)
MCH: 25.8 pg — ABNORMAL LOW (ref 26.0–34.0)
MCHC: 30.7 g/dL (ref 30.0–36.0)
MCV: 84.1 fL (ref 80.0–100.0)
Monocytes Absolute: 0.9 K/uL (ref 0.1–1.0)
Monocytes Relative: 8 %
Neutro Abs: 9.9 K/uL — ABNORMAL HIGH (ref 1.7–7.7)
Neutrophils Relative %: 82 %
Platelets: 442 K/uL — ABNORMAL HIGH (ref 150–400)
RBC: 5.34 MIL/uL (ref 4.22–5.81)
RDW: 13.6 % (ref 11.5–15.5)
WBC: 12.1 K/uL — ABNORMAL HIGH (ref 4.0–10.5)
nRBC: 0 % (ref 0.0–0.2)

## 2024-02-24 LAB — I-STAT CG4 LACTIC ACID, ED: Lactic Acid, Venous: 1.3 mmol/L (ref 0.5–1.9)

## 2024-02-24 MED ORDER — CEFADROXIL 500 MG PO CAPS: 500.0000 mg | ORAL_CAPSULE | Freq: Two times a day (BID) | ORAL | 0 refills | Status: DC

## 2024-02-24 MED ORDER — IOHEXOL 300 MG/ML  SOLN
100.0000 mL | Freq: Once | INTRAMUSCULAR | Status: AC | PRN
  Administered 2024-02-24: 100 mL via INTRAVENOUS

## 2024-02-24 MED ORDER — SODIUM CHLORIDE 0.9 % IV SOLN
1.0000 g | Freq: Once | INTRAVENOUS | Status: AC
  Administered 2024-02-24: 1 g via INTRAVENOUS
  Filled 2024-02-24: qty 10

## 2024-02-24 MED ORDER — ACETAMINOPHEN 325 MG PO TABS
650.0000 mg | ORAL_TABLET | Freq: Once | ORAL | Status: AC
  Administered 2024-02-24: 650 mg via ORAL
  Filled 2024-02-24: qty 2

## 2024-02-24 NOTE — ED Triage Notes (Signed)
 Patient c/o flu like symptoms x 2 days. Patient was seen yesterday with same complain. Patient report fever and took tylenol  at 1430 today. Patient report nausea denies vomiting.

## 2024-02-24 NOTE — ED Provider Notes (Signed)
 [place] EMERGENCY DEPARTMENT AT [facility] Provider Note   CSN: [id number] Arrival date & time: 02/24/24  1950     Patient presents with: Flu like symptoms    Ruben Mitchell is a 58 y.o. male.   HPI Patient presents with fever.  Sore throat.  Had been seen yesterday for same.  Negative flu COVID and strep testing yesterday.  Appears to have been given steroid shot.  Still feeling bad.  Now states he has had some dysuria.  Urine on the bedside table appears cloudy.  Also some abdominal pain.  Denies change in bowel habits.   Past Medical History:  Diagnosis Date   Cocaine abuse (HCC)    Suicide attempt [facility])    advises has attempted to hang self in past    Prior to Admission medications   Medication Sig Start Date End Date Taking? Authorizing Provider  cefadroxil  (DURICEF) 500 MG capsule Take 1 capsule (500 mg total) by mouth 2 (two) times daily. 02/24/24  Yes Patsey Lot, MD  albuterol  (VENTOLIN  HFA) 108 (90 Base) MCG/ACT inhaler Inhale 2 puffs into the lungs every 6 (six) hours as needed for wheezing or shortness of breath. 07/14/22   Brien Belvie BRAVO, MD  albuterol  (VENTOLIN  HFA) 108 [phone] Base) MCG/ACT inhaler Inhale 1-2 puffs into the lungs every 6 (six) hours as needed for wheezing or shortness of breath. 07/02/23   Laurice Maude BROCKS, MD  amLODipine  (NORVASC ) 5 MG tablet Take 1 tablet (5 mg total) by mouth daily. 12/16/23   Yolande Lamar BROCKS, MD  chlorhexidine  (PERIDEX ) 0.12 % solution Use as directed 15 mLs in the mouth or throat 2 (two) times daily. 12/16/23   Yolande Lamar BROCKS, MD  clindamycin  (CLEOCIN ) 150 MG capsule Take 1 capsule (150 mg total) by mouth every 6 (six) hours. 12/16/23   Yolande Lamar BROCKS, MD  oxyCODONE  (ROXICODONE ) 5 MG immediate release tablet Take 1 tablet (5 mg total) by mouth every 4 (four) hours as needed. 12/16/23   Yolande Lamar BROCKS, MD  predniSONE  (DELTASONE ) 10 MG tablet Take 4 tablets (40 mg total) by mouth daily. 07/02/23   Laurice Maude BROCKS, MD    Allergies: Aspirin and Penicillins    Review of Systems  Updated Vital Signs BP (!) 163/99   Pulse 89   Temp 99.6 F (37.6 C) (Oral)   Resp 15   SpO2 99%   Physical Exam Vitals and nursing note reviewed.  Cardiovascular:     Rate and Rhythm: Regular rhythm.  Abdominal:     Tenderness: There is abdominal tenderness.     Comments: Lower abdominal tenderness without rebound or guarding.  No hernia palpated.  Musculoskeletal:     Cervical back: Neck supple.  Skin:    General: Skin is warm.  Neurological:     Mental Status: He is alert and oriented to person, place, and time.     (all labs ordered are listed, but only abnormal results are displayed) Labs Reviewed  CBC WITH DIFFERENTIAL/PLATELET - Abnormal; Notable for the following components:      Result Value   WBC 12.1 (*)    MCH 25.8 (*)    Platelets 442 (*)    Neutro Abs 9.9 (*)    All other components within normal limits  COMPREHENSIVE METABOLIC PANEL WITH GFR - Abnormal; Notable for the following components:   CO2 21 (*)    BUN 21 (*)    All other components within normal limits  URINALYSIS, W/ REFLEX  TO CULTURE (INFECTION SUSPECTED) - Abnormal; Notable for the following components:   APPearance CLOUDY (*)    Hgb urine dipstick LARGE (*)    Protein, ur 30 (*)    Leukocytes,Ua MODERATE (*)    All other components within normal limits  RESP PANEL BY RT-PCR (RSV, FLU A&B, COVID)  RVPGX2  URINE CULTURE  I-STAT CG4 LACTIC ACID, ED  I-STAT CG4 LACTIC ACID, ED    EKG: None  Radiology: CT ABDOMEN PELVIS W CONTRAST Result Date: 02/24/2024 EXAM: CT ABDOMEN AND PELVIS WITH CONTRAST 02/24/2024 09:59:24 PM TECHNIQUE: CT of the abdomen and pelvis was performed with the administration of 100 mL of iohexol  (OMNIPAQUE ) 300 MG/ML solution. Multiplanar reformatted images are provided for review. Automated exposure control, iterative reconstruction, and/or weight-based adjustment of the mA/kV was utilized to  reduce the radiation dose to as low as reasonably achievable. COMPARISON: CT 12/19/2021 CLINICAL HISTORY: LLQ abdominal pain. Patient c/o flu like symptoms x 2 days. Patient was seen yesterday with same complain. Patient report fever and took tylenol  at 1430 today. Patient report nausea denies vomiting. FINDINGS: LOWER CHEST: No acute abnormality. LIVER: The liver is unremarkable. GALLBLADDER AND BILE DUCTS: Gallbladder is unremarkable. No biliary ductal dilatation. SPLEEN: No acute abnormality. PANCREAS: No acute abnormality. ADRENAL GLANDS: No acute abnormality. KIDNEYS, URETERS AND BLADDER: No stones in the kidneys or ureters. No hydronephrosis. No perinephric or periureteral stranding. Urinary bladder is unremarkable. GI AND BOWEL: Stomach demonstrates no acute abnormality. There is no bowel obstruction. Normal appendix. PERITONEUM AND RETROPERITONEUM: No ascites. No free air. VASCULATURE: Aorta is normal in caliber. LYMPH NODES: No lymphadenopathy. REPRODUCTIVE ORGANS: Enlarged prostate. BONES AND SOFT TISSUES: No acute osseous abnormality. No focal soft tissue abnormality. IMPRESSION: 1. No acute findings in the abdomen or pelvis. Electronically signed by: Norman Gatlin MD 02/24/2024 10:06 PM EDT RP Workstation: [id number]   DG Chest 2 View Result Date: 02/24/2024 CLINICAL DATA:  Cough and flu like symptoms. Sore throat started a week ago. EXAM: CHEST - 2 VIEW COMPARISON:  07/02/2023 FINDINGS: Normal heart size and pulmonary vascularity. No focal airspace disease or consolidation in the lungs. No blunting of costophrenic angles. No pneumothorax. Mediastinal contours appear intact. IMPRESSION: No active cardiopulmonary disease. Electronically Signed   By: Elsie Gravely M.D.   On: 02/24/2024 20:32     Procedures   Medications Ordered in the ED  acetaminophen  (TYLENOL ) tablet 650 mg (650 mg Oral Given 02/24/24 2003)  iohexol  (OMNIPAQUE ) 300 MG/ML solution 100 mL (100 mLs Intravenous Contrast Given  02/24/24 2147)  cefTRIAXone  (ROCEPHIN ) 1 g in sodium chloride  0.9 % 100 mL IVPB (1 g Intravenous New Bag/Given 02/24/24 2228)                                    Medical Decision Making Amount and/or Complexity of Data Reviewed Labs: ordered. Radiology: ordered.  Risk OTC drugs. Prescription drug management.   Patient with fever.  Does have a sore throat and was recently seen for that.  However does have abdominal pain.  Urine shows white cells without any bacteria.  White count elevated.  With abdominal tenderness will get CT scan to further evaluate.   CT scan reassuring.  Give dose of Rocephin  here.  Potential childhood allergy.  However will discharge home with Duricef.  No clear cause of pain.  Will treat as UTI.       Final diagnoses:  Acute cystitis without  hematuria    ED Discharge Orders          Ordered    cefadroxil  (DURICEF) 500 MG capsule  2 times daily        02/24/24 2302               Patsey Lot, MD 02/24/24 2303

## 2024-02-25 LAB — RESP PANEL BY RT-PCR (RSV, FLU A&B, COVID)  RVPGX2
Influenza A by PCR: NEGATIVE
Influenza B by PCR: NEGATIVE
Resp Syncytial Virus by PCR: NEGATIVE
SARS Coronavirus 2 by RT PCR: NEGATIVE

## 2024-02-29 ENCOUNTER — Emergency Department (HOSPITAL_COMMUNITY)
Admission: EM | Admit: 2024-02-29 | Discharge: 2024-02-29 | Disposition: A | Discharge status: Home / Self Care | Payer: Self-pay | Attending: Student in an Organized Health Care Education/Training Program | Admitting: Student in an Organized Health Care Education/Training Program

## 2024-02-29 ENCOUNTER — Emergency Department (HOSPITAL_COMMUNITY): Payer: Self-pay

## 2024-02-29 ENCOUNTER — Encounter (HOSPITAL_COMMUNITY): Payer: Self-pay

## 2024-02-29 ENCOUNTER — Other Ambulatory Visit: Payer: Self-pay

## 2024-02-29 DIAGNOSIS — N453 Epididymo-orchitis: Secondary | ICD-10-CM | POA: Insufficient documentation

## 2024-02-29 LAB — CBC WITH DIFFERENTIAL/PLATELET
Abs Immature Granulocytes: 0.05 K/uL (ref 0.00–0.07)
Basophils Absolute: 0 K/uL (ref 0.0–0.1)
Basophils Relative: 0 %
Eosinophils Absolute: 0.1 K/uL (ref 0.0–0.5)
Eosinophils Relative: 1 %
HCT: 41 % (ref 39.0–52.0)
Hemoglobin: 13.1 g/dL (ref 13.0–17.0)
Immature Granulocytes: 1 %
Lymphocytes Relative: 14 %
Lymphs Abs: 1.4 K/uL (ref 0.7–4.0)
MCH: 26.4 pg (ref 26.0–34.0)
MCHC: 32 g/dL (ref 30.0–36.0)
MCV: 82.5 fL (ref 80.0–100.0)
Monocytes Absolute: 0.8 K/uL (ref 0.1–1.0)
Monocytes Relative: 8 %
Neutro Abs: 7.8 K/uL — ABNORMAL HIGH (ref 1.7–7.7)
Neutrophils Relative %: 76 %
Platelets: 426 K/uL — ABNORMAL HIGH (ref 150–400)
RBC: 4.97 MIL/uL (ref 4.22–5.81)
RDW: 13.5 % (ref 11.5–15.5)
WBC: 10.1 K/uL (ref 4.0–10.5)
nRBC: 0 % (ref 0.0–0.2)

## 2024-02-29 LAB — BASIC METABOLIC PANEL WITH GFR
Anion gap: 11 (ref 5–15)
BUN: 13 mg/dL (ref 6–20)
CO2: 23 mmol/L (ref 22–32)
Calcium: 8.4 mg/dL — ABNORMAL LOW (ref 8.9–10.3)
Chloride: 104 mmol/L (ref 98–111)
Creatinine, Ser: 1.16 mg/dL (ref 0.61–1.24)
GFR, Estimated: >60 mL/min (ref >60)
Glucose, Bld: 136 mg/dL — ABNORMAL HIGH (ref 70–99)
Potassium: 3.8 mmol/L (ref 3.5–5.1)
Sodium: 138 mmol/L (ref 135–145)

## 2024-02-29 LAB — URINALYSIS, ROUTINE W REFLEX MICROSCOPIC
Bilirubin Urine: NEGATIVE
Glucose, UA: NEGATIVE mg/dL
Ketones, ur: NEGATIVE mg/dL
Nitrite: NEGATIVE
Protein, ur: NEGATIVE mg/dL
Specific Gravity, Urine: 1.024 (ref 1.005–1.030)
pH: 5 (ref 5.0–8.0)

## 2024-02-29 MED ORDER — IBUPROFEN 400 MG PO TABS
600.0000 mg | ORAL_TABLET | Freq: Once | ORAL | Status: AC
  Administered 2024-02-29: 600 mg via ORAL
  Filled 2024-02-29: qty 1

## 2024-02-29 MED ORDER — CEFTRIAXONE SODIUM 500 MG IJ SOLR
500.0000 mg | Freq: Once | INTRAMUSCULAR | Status: AC
  Administered 2024-02-29: 500 mg via INTRAMUSCULAR
  Filled 2024-02-29: qty 500

## 2024-02-29 MED ORDER — DOXYCYCLINE HYCLATE 100 MG PO CAPS: 100.0000 mg | ORAL_CAPSULE | Freq: Two times a day (BID) | ORAL | 0 refills | Status: DC

## 2024-02-29 MED ORDER — DOXYCYCLINE HYCLATE 100 MG PO TABS
100.0000 mg | ORAL_TABLET | Freq: Once | ORAL | Status: AC
  Administered 2024-02-29: 100 mg via ORAL
  Filled 2024-02-29: qty 1

## 2024-02-29 MED ORDER — LIDOCAINE HCL (PF) 1 % IJ SOLN
INTRAMUSCULAR | Status: AC
  Filled 2024-02-29: qty 5

## 2024-02-29 NOTE — ED Provider Triage Note (Signed)
 Emergency Medicine Provider Triage Evaluation Note  Ruben Mitchell , a 58 y.o. male  was evaluated in triage.  Pt complains of right testicle, intermittent for months. Worse with heavy lifting.  Review of Systems  Positive: Right testicle pain Negative: Vomiting, changes in bowel or bladder habits.   Physical Exam  BP 112/84   Pulse 75   Temp 98.5 F (36.9 C) (Oral)   Resp 20   SpO2 99%  Gen:   Awake, no distress   Resp:  Normal effort  MSK:   Moves extremities without difficulty  Other:  Chaperone present, right testicle swollen, tender   Medical Decision Making  Medically screening exam initiated at 12:09 PM.  Appropriate orders placed.  Ruben Mitchell was informed that the remainder of the evaluation will be completed by another provider, this initial triage assessment does not replace that evaluation, and the importance of remaining in the ED until their evaluation is complete.     Beverley Leita LABOR, PA-C 02/29/24 1213

## 2024-02-29 NOTE — Discharge Instructions (Signed)
 Please schedule appointment with urology for reevaluation. Please take the antibiotics as prescribed.

## 2024-02-29 NOTE — ED Triage Notes (Signed)
 PT arrives via POV. Pt c/o pain and swelling to his testicles over the past 10 days. Denies any discharge or dysuria. Pt AxOx4.

## 2024-02-29 NOTE — ED Triage Notes (Signed)
 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

## 2024-02-29 NOTE — ED Provider Notes (Signed)
 [place] [facility] AT [facility] Provider Note   CSN: [id number] Arrival date & time: 02/29/24  1150     Patient presents with: Groin Swelling   Ruben Mitchell is a 58 y.o. male.   58 year old male presenting to the emergency department with left testicle pain.  He reports on and off pain with swelling for several years.  He reports getting prescribed an antibiotic last week for dysuria but states he did not take it.  He denies any current dysuria but reports this new swelling and tenderness in the left testicle.  He denies being currently sexually active.  He denies any trauma to the testicle.  He does not follow with urology.        Prior to Admission medications   Medication Sig Start Date End Date Taking? Authorizing Provider  doxycycline  (VIBRAMYCIN ) 100 MG capsule Take 1 capsule (100 mg total) by mouth 2 (two) times daily. 02/29/24  Yes Parris Signer, DO  albuterol  (VENTOLIN  HFA) 108 (90 Base) MCG/ACT inhaler Inhale 2 puffs into the lungs every 6 (six) hours as needed for wheezing or shortness of breath. 07/14/22   Brien Belvie BRAVO, MD  albuterol  (VENTOLIN  HFA) 108 (90 Base) MCG/ACT inhaler Inhale 1-2 puffs into the lungs every 6 (six) hours as needed for wheezing or shortness of breath. 07/02/23   Laurice Maude BROCKS, MD  amLODipine  (NORVASC ) 5 MG tablet Take 1 tablet (5 mg total) by mouth daily. 12/16/23   Yolande Lamar BROCKS, MD  cefadroxil  (DURICEF) 500 MG capsule Take 1 capsule (500 mg total) by mouth 2 (two) times daily. 02/24/24   Patsey Lot, MD  chlorhexidine  (PERIDEX ) 0.12 % solution Use as directed 15 mLs in the mouth or throat 2 (two) times daily. 12/16/23   Yolande Lamar BROCKS, MD  clindamycin  (CLEOCIN ) 150 MG capsule Take 1 capsule (150 mg total) by mouth every 6 (six) hours. 12/16/23   Yolande Lamar BROCKS, MD  oxyCODONE  (ROXICODONE ) 5 MG immediate release tablet Take 1 tablet (5 mg total) by mouth every 4 (four) hours as needed. 12/16/23   Yolande Lamar BROCKS, MD  predniSONE  (DELTASONE ) 10 MG tablet Take 4 tablets (40 mg total) by mouth daily. 07/02/23   Laurice Maude BROCKS, MD    Allergies: Aspirin and Penicillins    Review of Systems  All other systems reviewed and are negative.   Updated Vital Signs BP 119/79 (BP Location: Right Arm)   Pulse 68   Temp 99 F (37.2 C) (Oral)   Resp 16   Ht 5' 9 (1.753 m)   Wt 92 kg   SpO2 98%   BMI 29.95 kg/m   Physical Exam Vitals and nursing note reviewed. Exam conducted with a chaperone present.  Constitutional:      General: He is not in acute distress. Eyes:     Conjunctiva/sclera: Conjunctivae normal.  Cardiovascular:     Rate and Rhythm: Normal rate.     Pulses: Normal pulses.  Pulmonary:     Effort: Pulmonary effort is normal.  Abdominal:     General: Abdomen is flat.     Palpations: Abdomen is soft.  Genitourinary:    Penis: Normal.      Testes: Cremasteric reflex is present.        Left: Tenderness and swelling present.     Epididymis:     Right: Normal.     Left: Tenderness present.  Skin:    General: Skin is warm.  Neurological:  Mental Status: He is alert and oriented to person, place, and time.     (all labs ordered are listed, but only abnormal results are displayed) Labs Reviewed  CBC WITH DIFFERENTIAL/PLATELET - Abnormal; Notable for the following components:      Result Value   Platelets 426 (*)    Neutro Abs 7.8 (*)    All other components within normal limits  BASIC METABOLIC PANEL WITH GFR - Abnormal; Notable for the following components:   Glucose, Bld 136 (*)    Calcium 8.4 (*)    All other components within normal limits  URINALYSIS, ROUTINE W REFLEX MICROSCOPIC - Abnormal; Notable for the following components:   APPearance HAZY (*)    Hgb urine dipstick SMALL (*)    Leukocytes,Ua SMALL (*)    Bacteria, UA RARE (*)    All other components within normal limits    EKG: None  Radiology: US  SCROTUM W/DOPPLER Result Date:  02/29/2024 CLINICAL DATA:  Right testicular pain and swelling. EXAM: SCROTAL ULTRASOUND DOPPLER ULTRASOUND OF THE TESTICLES TECHNIQUE: Complete ultrasound examination of the testicles, epididymis, and other scrotal structures was performed. Color and spectral Doppler ultrasound were also utilized to evaluate blood flow to the testicles. COMPARISON:  Prior study [facility] 12/19/2021 FINDINGS: Right testicle Measurements: 3.5 x 2.3 x 3.2 cm. No mass or microlithiasis visualized. Relative hyperemia of the right testicle compared to the left. Stable small subcapsular cyst along the periphery of the right testicle measuring up to 0.6 cm and having a benign appearance. Left testicle Measurements: 3.8 x 2.1 x 2.7 cm. No mass or microlithiasis visualized. Right epididymis:  Enlarged and hyperemic. Left epididymis:  Normal in size and appearance. Hydrocele: Moderate right hydrocele with debris. Small left hydrocele. Varicocele:  None visualized. Pulsed Doppler interrogation of both testes demonstrates normal low resistance arterial and venous waveforms bilaterally. Other: Scrotal skin thickening.  No focal abscess identified. IMPRESSION: 1. Findings consistent with right-sided epididymo-orchitis. 2. Moderate right hydrocele with debris. 3. Small left hydrocele. 4. Scrotal skin thickening. Electronically Signed   By: Marcey Moan M.D.   On: 02/29/2024 13:58     Procedures   Medications Ordered in the ED  cefTRIAXone  (ROCEPHIN ) injection 500 mg (has no administration in time range)  doxycycline  (VIBRA -TABS) tablet 100 mg (has no administration in time range)                                    Medical Decision Making 58 year old male presenting to the emergency department for evaluation of his left testicle discomfort.  Differential includes but not limited to epididymitis, orchitis, UTI, hydrocele, and others. He did have obvious swelling to the left testicle with tenderness to palpation.   Cremasteric reflex intact. Ultrasound of the testicle shows evidence of epididymal orchitis.  He also has a small hydrocele.  Good Doppler flow to both testes.  Some bacteria and leukocytes esterase in his urine.  We will go ahead and treat him with antibiotics.  We discussed scrotal rest and limiting his strenuous activity at work.  We will give him IM Rocephin  and doxycycline .  He will also receive follow-up to urology.  Risk Prescription drug management.    Final diagnoses:  Epididymoorchitis    ED Discharge Orders          Ordered    doxycycline  (VIBRAMYCIN ) 100 MG capsule  2 times daily  02/29/24 1752               Hilary Pundt, DO 02/29/24 1759

## 2024-06-10 ENCOUNTER — Other Ambulatory Visit (HOSPITAL_COMMUNITY): Payer: Self-pay

## 2024-06-10 ENCOUNTER — Emergency Department (HOSPITAL_COMMUNITY): Payer: Self-pay

## 2024-06-10 ENCOUNTER — Encounter (HOSPITAL_COMMUNITY): Payer: Self-pay

## 2024-06-10 ENCOUNTER — Other Ambulatory Visit: Payer: Self-pay

## 2024-06-10 ENCOUNTER — Emergency Department (HOSPITAL_COMMUNITY)
Admission: EM | Admit: 2024-06-10 | Discharge: 2024-06-10 | Disposition: A | Discharge status: Home / Self Care | Payer: Self-pay | Attending: Emergency Medicine | Admitting: Emergency Medicine

## 2024-06-10 DIAGNOSIS — Z7951 Long term (current) use of inhaled steroids: Secondary | ICD-10-CM | POA: Insufficient documentation

## 2024-06-10 DIAGNOSIS — Z87891 Personal history of nicotine dependence: Secondary | ICD-10-CM | POA: Insufficient documentation

## 2024-06-10 DIAGNOSIS — J181 Lobar pneumonia, unspecified organism: Secondary | ICD-10-CM | POA: Insufficient documentation

## 2024-06-10 DIAGNOSIS — Z79899 Other long term (current) drug therapy: Secondary | ICD-10-CM | POA: Insufficient documentation

## 2024-06-10 DIAGNOSIS — J441 Chronic obstructive pulmonary disease with (acute) exacerbation: Secondary | ICD-10-CM | POA: Insufficient documentation

## 2024-06-10 DIAGNOSIS — J189 Pneumonia, unspecified organism: Secondary | ICD-10-CM

## 2024-06-10 LAB — RESP PANEL BY RT-PCR (RSV, FLU A&B, COVID)  RVPGX2
Influenza A by PCR: NEGATIVE
Influenza B by PCR: NEGATIVE
Resp Syncytial Virus by PCR: NEGATIVE
SARS Coronavirus 2 by RT PCR: NEGATIVE

## 2024-06-10 MED ORDER — CEFUROXIME AXETIL 250 MG PO TABS
250.0000 mg | ORAL_TABLET | Freq: Two times a day (BID) | ORAL | 0 refills | Status: DC
  Filled 2024-06-10: qty 13, 7d supply, fill #0

## 2024-06-10 MED ORDER — DEXAMETHASONE 4 MG PO TABS
10.0000 mg | ORAL_TABLET | Freq: Once | ORAL | Status: AC
  Administered 2024-06-10: 10 mg via ORAL
  Filled 2024-06-10: qty 3

## 2024-06-10 MED ORDER — CEFUROXIME AXETIL 250 MG PO TABS
500.0000 mg | ORAL_TABLET | Freq: Once | ORAL | Status: AC
  Administered 2024-06-10: 500 mg via ORAL
  Filled 2024-06-10: qty 2

## 2024-06-10 MED ORDER — AZITHROMYCIN 250 MG PO TABS
250.0000 mg | ORAL_TABLET | Freq: Every day | ORAL | 0 refills | Status: DC
  Filled 2024-06-10 – 2024-06-11 (×3): qty 4, 4d supply, fill #0

## 2024-06-10 MED ORDER — ALBUTEROL SULFATE HFA 108 (90 BASE) MCG/ACT IN AERS
2.0000 | INHALATION_SPRAY | Freq: Once | RESPIRATORY_TRACT | Status: AC
  Administered 2024-06-10: 2 via RESPIRATORY_TRACT
  Filled 2024-06-10: qty 6.7

## 2024-06-10 MED ORDER — AZITHROMYCIN 250 MG PO TABS
500.0000 mg | ORAL_TABLET | Freq: Once | ORAL | Status: AC
  Administered 2024-06-10: 500 mg via ORAL
  Filled 2024-06-10: qty 2

## 2024-06-10 NOTE — ED Triage Notes (Signed)
 Pt states he has a bad cough for four days with yellow phlegm that has changed to white. Pt states he has chest tightness.   Pt endorses body aches  Pt denies fevers, chills, N/V, diarrhea, and headache

## 2024-06-10 NOTE — Discharge Instructions (Signed)
 Use the albuterol  inhaler 1-2 puffs every 4 hours as needed for cough, shortness of breath, wheezing.  We are also putting you on antibiotics.  You were given the first dose in the emergency department.  If you develop fever, coughing up blood, trouble breathing, or any other new/concerning symptoms then return to the ER.

## 2024-06-10 NOTE — ED Provider Notes (Signed)
 " [place] [facility] AT [facility] Provider Note   CSN: [id number] Arrival date & time: 06/10/24  9061     Patient presents with: No chief complaint on file.   Ruben Mitchell is a 59 y.o. male.   HPI 59 year old male presents with a chief complaint of cough.  He is had a cough with some yellow and now white sputum for about 4 days.  His chest feels tight and he is short of breath..  He denies any fever or sore throat though does have some rhinorrhea.  He endorsed body aches in triage though denies these to me.  He has a history of smoking intermittently but denies a history of COPD (though it is listed in his chart and 2024).  Prior to Admission medications  Medication Sig Start Date End Date Taking? Authorizing Provider  azithromycin  (ZITHROMAX ) 250 MG tablet Take 1 tablet (250 mg total) by mouth daily. 06/11/24  Yes Freddi Hamilton, MD  cefUROXime  (CEFTIN ) 250 MG tablet Take 1 tablet (250 mg total) by mouth 2 (two) times daily with a meal. 06/11/24  Yes Freddi Hamilton, MD  albuterol  (VENTOLIN  HFA) 108 (90 Base) MCG/ACT inhaler Inhale 2 puffs into the lungs every 6 (six) hours as needed for wheezing or shortness of breath. 07/14/22   Brien Belvie BRAVO, MD  albuterol  (VENTOLIN  HFA) 108 [phone] Base) MCG/ACT inhaler Inhale 1-2 puffs into the lungs every 6 (six) hours as needed for wheezing or shortness of breath. 07/02/23   Laurice Maude BROCKS, MD  amLODipine  (NORVASC ) 5 MG tablet Take 1 tablet (5 mg total) by mouth daily. 12/16/23   Yolande Lamar BROCKS, MD  cefadroxil  (DURICEF) 500 MG capsule Take 1 capsule (500 mg total) by mouth 2 (two) times daily. 02/24/24   Patsey Lot, MD  chlorhexidine  (PERIDEX ) 0.12 % solution Use as directed 15 mLs in the mouth or throat 2 (two) times daily. 12/16/23   Yolande Lamar BROCKS, MD  clindamycin  (CLEOCIN ) 150 MG capsule Take 1 capsule (150 mg total) by mouth every 6 (six) hours. 12/16/23   Yolande Lamar BROCKS, MD  doxycycline  (VIBRAMYCIN ) 100  MG capsule Take 1 capsule (100 mg total) by mouth 2 (two) times daily. 02/29/24   Lowther, Amy, DO  oxyCODONE  (ROXICODONE ) 5 MG immediate release tablet Take 1 tablet (5 mg total) by mouth every 4 (four) hours as needed. 12/16/23   Yolande Lamar BROCKS, MD  predniSONE  (DELTASONE ) 10 MG tablet Take 4 tablets (40 mg total) by mouth daily. 07/02/23   Laurice Maude BROCKS, MD    Allergies: Aspirin and Penicillins    Review of Systems  Constitutional:  Negative for fever.  HENT:  Negative for sore throat.   Respiratory:  Positive for cough, chest tightness and shortness of breath.   Cardiovascular:  Negative for chest pain.  Gastrointestinal:  Negative for vomiting.    Updated Vital Signs BP 132/74   Pulse 89   Temp 98.2 F (36.8 C) (Oral)   Resp 20   Ht 5' 9 (1.753 m)   Wt 92.1 kg   SpO2 100%   BMI 29.98 kg/m   Physical Exam Vitals and nursing note reviewed.  Constitutional:      General: He is not in acute distress.    Appearance: He is well-developed. He is not ill-appearing or diaphoretic.  HENT:     Head: Normocephalic and atraumatic.  Cardiovascular:     Rate and Rhythm: Normal rate and regular rhythm.     Heart sounds:  Normal heart sounds.  Pulmonary:     Effort: Pulmonary effort is normal.     Breath sounds: Wheezing (mild, expiratory, diffuse) present.  Abdominal:     Palpations: Abdomen is soft.     Tenderness: There is no abdominal tenderness.  Skin:    General: Skin is warm and dry.  Neurological:     Mental Status: He is alert.     (all labs ordered are listed, but only abnormal results are displayed) Labs Reviewed  RESP PANEL BY RT-PCR (RSV, FLU A&B, COVID)  [id number]    EKG: EKG Interpretation Date/Time:  Sunday June 10 2024 10:38:50 EST Ventricular Rate:  88 PR Interval:  156 QRS Duration:  84 QT Interval:  366 QTC Calculation: 442 R Axis:   4  Text Interpretation: Normal sinus rhythm Biatrial enlargement no acute ST/T changes similar to 2024  Confirmed by Traeger Sultana ([id number]) on 06/10/2024 1:10:06 PM  Radiology: DG Chest 2 View Result Date: 06/10/2024 CLINICAL DATA:  Shortness of breath. EXAM: CHEST - 2 VIEW COMPARISON:  February 24, 2024 FINDINGS: The heart size and mediastinal contours are within normal limits. Mild infiltrate is seen within the left lung base. No pleural effusion or pneumothorax is identified. The visualized skeletal structures are unremarkable. IMPRESSION: Mild left basilar infiltrate. Electronically Signed   By: Thaddeus  Houston M.D.   On: 06/10/2024 11:24     Procedures   Medications Ordered in the ED  albuterol (VENTOLIN HFA) 108 (90 Base) MCG/ACT inhaler 2 puff (2 puffs Inhalation Given 06/10/24 1126)  dexamethasone (DECADRON) tablet 10 mg (10 mg Oral Given 06/10/24 1333)  cefUROXime (CEFTIN) tablet 500 mg (500 mg Oral Given 06/10/24 1334)  azithromycin (ZITHROMAX) tablet 500 mg (500 mg Oral Given 06/10/24 1334)                                    Medical Decision Making Amount and/or Complexity of Data Reviewed Labs:     Details: COVID/flu/RSV negative Radiology: ordered and independent interpretation performed.    Details: Likely left-sided pneumonia  Risk Prescription drug management.   Patient presents with cough.  Probably has some pneumonia based on x-ray.  Overall well-appearing with stable vitals.  No hypoxia.  Feels better with albuterol.  I suspect he does have COPD as this has been diagnosed in his chart.  Was given a dose of dexamethasone for what appears to be a mild COPD exacerbation in the setting of pneumonia.  Will put on antibiotics.  He appears stable as an outpatient for treatment.  Given return precautions.     Final diagnoses:  Community acquired pneumonia of left lower lobe of lung    ED Discharge Orders          Ordered    cefUROXime (CEFTIN) 250 MG tablet  2 times daily with meals        06/10/24 1318    azithromycin (ZITHROMAX) 250 MG tablet  Daily         01 /11/26 1318               Freddi Hamilton, MD 06/10/24 1532  "

## 2024-06-11 ENCOUNTER — Other Ambulatory Visit (HOSPITAL_COMMUNITY): Payer: Self-pay

## 2024-06-11 ENCOUNTER — Other Ambulatory Visit: Payer: Self-pay

## 2024-06-11 ENCOUNTER — Other Ambulatory Visit: Payer: Self-pay | Admitting: *Deleted

## 2024-06-11 DIAGNOSIS — J189 Pneumonia, unspecified organism: Secondary | ICD-10-CM

## 2024-06-11 MED ORDER — PREDNISONE 10 MG PO TABS
40.0000 mg | ORAL_TABLET | Freq: Every day | ORAL | 0 refills | Status: AC
  Filled 2024-06-11: qty 16, 4d supply, fill #0

## 2024-06-11 MED ORDER — CEFUROXIME AXETIL 250 MG PO TABS
250.0000 mg | ORAL_TABLET | Freq: Two times a day (BID) | ORAL | 0 refills | Status: AC
  Filled 2024-06-11: qty 13, 7d supply, fill #0

## 2024-06-11 MED ORDER — AZITHROMYCIN 250 MG PO TABS
250.0000 mg | ORAL_TABLET | Freq: Every day | ORAL | 0 refills | Status: AC
  Filled 2024-06-11: qty 4, 4d supply, fill #0

## 2024-06-11 NOTE — Progress Notes (Signed)
 Came by for help to get meds filled from recent hospitalization.  Meds sent to [facility]. He has applied for [facility] but has not heard back. I'll reach out to that program to see what the process is. Per [facility], He needs a CM to help him apply for the program and set up a housing program. He is ware of this and will work with [facility] CM

## 2024-06-19 ENCOUNTER — Other Ambulatory Visit: Payer: Self-pay | Admitting: *Deleted

## 2024-06-19 NOTE — Progress Notes (Signed)
 He came back for follow up treatment for pneumonia. He completed his medication and feels better No resp red flags. PO 97% Lungs clear.  Heart RRR.   He also requested assist with reduced bus fare.  CM to assist
